# Patient Record
Sex: Female | Born: 1955
Health system: Southern US, Community
[De-identification: ages and names within clinical notes are randomized; demographics above are authoritative.]

## PROBLEM LIST (undated history)

## (undated) DIAGNOSIS — L409 Psoriasis, unspecified: Secondary | ICD-10-CM

## (undated) DIAGNOSIS — I10 Essential (primary) hypertension: Secondary | ICD-10-CM

## (undated) DIAGNOSIS — K219 Gastro-esophageal reflux disease without esophagitis: Secondary | ICD-10-CM

## (undated) DIAGNOSIS — G4733 Obstructive sleep apnea (adult) (pediatric): Secondary | ICD-10-CM

## (undated) DIAGNOSIS — J309 Allergic rhinitis, unspecified: Secondary | ICD-10-CM

## (undated) DIAGNOSIS — M1990 Unspecified osteoarthritis, unspecified site: Secondary | ICD-10-CM

## (undated) DIAGNOSIS — R011 Cardiac murmur, unspecified: Secondary | ICD-10-CM

## (undated) DIAGNOSIS — G5760 Lesion of plantar nerve, unspecified lower limb: Secondary | ICD-10-CM

## (undated) DIAGNOSIS — M26629 Arthralgia of temporomandibular joint, unspecified side: Secondary | ICD-10-CM

## (undated) DIAGNOSIS — B351 Tinea unguium: Secondary | ICD-10-CM

## (undated) DIAGNOSIS — H548 Legal blindness, as defined in USA: Secondary | ICD-10-CM

## (undated) DIAGNOSIS — M19019 Primary osteoarthritis, unspecified shoulder: Secondary | ICD-10-CM

## (undated) DIAGNOSIS — Z87442 Personal history of urinary calculi: Secondary | ICD-10-CM

## (undated) DIAGNOSIS — D649 Anemia, unspecified: Secondary | ICD-10-CM

## (undated) DIAGNOSIS — Z8719 Personal history of other diseases of the digestive system: Secondary | ICD-10-CM

## (undated) DIAGNOSIS — H3530 Unspecified macular degeneration: Secondary | ICD-10-CM

## (undated) HISTORY — DX: Unspecified osteoarthritis, unspecified site: M19.90

## (undated) HISTORY — DX: Gastro-esophageal reflux disease without esophagitis: K21.9

## (undated) HISTORY — DX: Essential (primary) hypertension: I10

## (undated) HISTORY — DX: Arthralgia of temporomandibular joint, unspecified side: M26.629

## (undated) HISTORY — PX: KNEE ARTHROSCOPY: SUR90

## (undated) HISTORY — DX: Unspecified macular degeneration: H35.30

## (undated) HISTORY — DX: Legal blindness, as defined in USA: H54.8

## (undated) HISTORY — DX: Lesion of plantar nerve, unspecified lower limb: G57.60

## (undated) HISTORY — DX: Primary osteoarthritis, unspecified shoulder: M19.019

## (undated) HISTORY — DX: Allergic rhinitis, unspecified: J30.9

## (undated) HISTORY — PX: CHOLECYSTECTOMY: SHX55

## (undated) HISTORY — PX: ABDOMINAL HYSTERECTOMY: SHX81

## (undated) HISTORY — DX: Obstructive sleep apnea (adult) (pediatric): G47.33

## (undated) HISTORY — PX: OTHER SURGICAL HISTORY: SHX169

## (undated) HISTORY — PX: COLONOSCOPY: SHX174

## (undated) HISTORY — DX: Tinea unguium: B35.1

## (undated) HISTORY — DX: Psoriasis, unspecified: L40.9

---

## 1998-03-18 ENCOUNTER — Ambulatory Visit (HOSPITAL_COMMUNITY): Admission: RE | Admit: 1998-03-18 | Discharge: 1998-03-18 | Payer: Self-pay | Admitting: Family Medicine

## 1998-03-18 ENCOUNTER — Encounter: Payer: Self-pay | Admitting: Family Medicine

## 1998-06-29 ENCOUNTER — Ambulatory Visit (HOSPITAL_COMMUNITY): Admission: RE | Admit: 1998-06-29 | Discharge: 1998-06-29 | Payer: Self-pay | Admitting: Gastroenterology

## 1998-07-24 ENCOUNTER — Encounter: Payer: Self-pay | Admitting: Surgery

## 1998-07-24 ENCOUNTER — Ambulatory Visit (HOSPITAL_COMMUNITY): Admission: RE | Admit: 1998-07-24 | Discharge: 1998-07-24 | Payer: Self-pay | Admitting: Surgery

## 1998-09-04 ENCOUNTER — Encounter: Payer: Self-pay | Admitting: Surgery

## 1998-09-09 ENCOUNTER — Encounter: Payer: Self-pay | Admitting: Surgery

## 1998-09-09 ENCOUNTER — Inpatient Hospital Stay (HOSPITAL_COMMUNITY): Admission: RE | Admit: 1998-09-09 | Discharge: 1998-09-12 | Payer: Self-pay | Admitting: Surgery

## 1998-09-10 ENCOUNTER — Encounter: Payer: Self-pay | Admitting: Surgery

## 1999-07-13 ENCOUNTER — Ambulatory Visit (HOSPITAL_COMMUNITY): Admission: RE | Admit: 1999-07-13 | Discharge: 1999-07-13 | Payer: Self-pay | Admitting: Gastroenterology

## 1999-07-13 ENCOUNTER — Encounter (INDEPENDENT_AMBULATORY_CARE_PROVIDER_SITE_OTHER): Payer: Self-pay | Admitting: Specialist

## 1999-09-24 ENCOUNTER — Ambulatory Visit (HOSPITAL_COMMUNITY): Admission: RE | Admit: 1999-09-24 | Discharge: 1999-09-24 | Payer: Self-pay | Admitting: Gastroenterology

## 2000-11-04 ENCOUNTER — Encounter: Payer: Self-pay | Admitting: Orthopedic Surgery

## 2000-11-04 ENCOUNTER — Ambulatory Visit (HOSPITAL_COMMUNITY): Admission: RE | Admit: 2000-11-04 | Discharge: 2000-11-04 | Payer: Self-pay | Admitting: Orthopedic Surgery

## 2001-02-09 ENCOUNTER — Encounter (INDEPENDENT_AMBULATORY_CARE_PROVIDER_SITE_OTHER): Payer: Self-pay | Admitting: *Deleted

## 2001-02-09 ENCOUNTER — Ambulatory Visit (HOSPITAL_COMMUNITY): Admission: RE | Admit: 2001-02-09 | Discharge: 2001-02-09 | Payer: Self-pay | Admitting: Gastroenterology

## 2002-05-21 ENCOUNTER — Encounter: Admission: RE | Admit: 2002-05-21 | Discharge: 2002-05-21 | Payer: Self-pay | Admitting: *Deleted

## 2002-05-21 ENCOUNTER — Encounter: Payer: Self-pay | Admitting: *Deleted

## 2003-07-09 ENCOUNTER — Encounter: Admission: RE | Admit: 2003-07-09 | Discharge: 2003-07-09 | Payer: Self-pay | Admitting: *Deleted

## 2003-10-17 ENCOUNTER — Ambulatory Visit (HOSPITAL_COMMUNITY): Admission: RE | Admit: 2003-10-17 | Discharge: 2003-10-17 | Payer: Self-pay | Admitting: Gastroenterology

## 2003-10-17 ENCOUNTER — Encounter (INDEPENDENT_AMBULATORY_CARE_PROVIDER_SITE_OTHER): Payer: Self-pay | Admitting: Specialist

## 2004-07-15 ENCOUNTER — Encounter: Admission: RE | Admit: 2004-07-15 | Discharge: 2004-07-15 | Payer: Self-pay | Admitting: Obstetrics and Gynecology

## 2009-09-24 ENCOUNTER — Ambulatory Visit (HOSPITAL_COMMUNITY): Admission: RE | Admit: 2009-09-24 | Discharge: 2009-09-24 | Payer: Self-pay | Admitting: Urology

## 2010-06-10 ENCOUNTER — Ambulatory Visit (HOSPITAL_COMMUNITY)
Admission: RE | Admit: 2010-06-10 | Discharge: 2010-06-10 | Payer: Self-pay | Source: Home / Self Care | Attending: Gastroenterology | Admitting: Gastroenterology

## 2010-08-02 ENCOUNTER — Other Ambulatory Visit: Payer: Self-pay | Admitting: Gynecologic Oncology

## 2010-08-02 DIAGNOSIS — IMO0002 Reserved for concepts with insufficient information to code with codable children: Secondary | ICD-10-CM

## 2010-08-27 ENCOUNTER — Other Ambulatory Visit: Payer: Self-pay | Admitting: Dermatology

## 2010-10-20 ENCOUNTER — Ambulatory Visit: Payer: Self-pay | Admitting: Gynecologic Oncology

## 2010-10-20 ENCOUNTER — Other Ambulatory Visit (HOSPITAL_COMMUNITY): Payer: Self-pay

## 2010-11-19 NOTE — Procedures (Signed)
[place]. [facility]  Patient:    Kathryn Pittman                  MRN: [id number] Proc. Date: 07/13/99 Adm. Date:  [id number] Attending:  Rich Brave CC:         Thornton Park. Daphine Deutscher, M.D.             Alfonzo Beers, P.A.C.             at [facility]                           Procedure Report  PROCEDURE:  Upper endoscopy with biopsies.  INDICATION:  Follow-up of Barretts esophagus with biopsies indeterminate for dysplasia a year ago.  In the interim, approximately nine months ago, the patient underwent surgical repair of a large hiatal hernia and an antireflux procedure.   FINDINGS:  Distal esophagitis.  DESCRIPTION OF PROCEDURE:  The nature, purpose, and risks of the procedure were  familiar to the patient from prior examination and she provided written consent. Sedation was fentanyl 75 mcg and Versed 7 mg IV without arrhythmias or desaturation.  The new Olympus video adult endoscope was passed under direct vision.  The vocal cords looked grossly normal and the esophagus was easily entered.  The proximal esophagus was normal.  In contrast to her previous procedure, no free gastroesophageal reflux was noted at this time.  However, the distal 2 to 3 cm f the esophagus had inflammatory changes characterized by squamous peeling erythema and friability without any evidence of mass-effect.  There was a ringlike stricture present but it was fairly widely patent and offered no resistance to passage of the small 8 mm endoscope.  There did appear to be a  residual 2 to 3 cm hiatal hernia.  The stomach was entered.  It contained a small clear residual.  The gastric mucosa was unremarkable, specifically without evidence of gastritis, erosions, ulcers,  polyps, or masses and the pylorus, duodenal bulb, and second duodenum were unremarkable.  Retroflex view of the proximal stomach was unremarkable, specifically  without any obvious patulous diaphragmatic hiatus but also without an obvious "wrap" effect.  In view of the inflammatory changes in the distal esophagus, I elected not to proceed with esophageal dilatation at this time even though the patient did have an episode of recurrent significant dysphagia recently.  However, multiple biopsies were obtained from the inflamed segment prior to removal of the scope.  The patient tolerated the procedure well and there were no apparent complications.  IMPRESSION: 1. Distal esophagitis. 2. Small residual hiatal hernia.  PLAN:  Await pathology and biopsies.  Reinstitute PPI medication therapy. Follow-up endoscopy in about two months to assess healing of the esophagus, and if healed, perform esophageal dilatation at that time. DD:  07/13/99 TD:  07/13/99 Job: [id number]

## 2010-11-19 NOTE — Procedures (Signed)
[place]. [facility]  Patient:    Kathryn Pittman, Kathryn Pittman                 MRN: [id number] Proc. Date: 02/09/01 Adm. Date:  [id number] Attending:  Rich Brave CC:         Norville Haggard, PA-C [facility]   Procedure Report  PROCEDURE PERFORMED:  Upper endoscopy with biopsies.  ENDOSCOPIST:  Florencia Reasons, M.D.  INDICATIONS FOR PROCEDURE:  This 55 year old female several years go had endoscopic biopsies of what appeared to be Barretts appearing mucosa.  They came back indeterminate for dysplasia.  Subsequently, the patient underwent antireflux surgery and her esophagitis resolved endoscopic evaluation about a year and a half ago showed no evident Barretts, so no biopsies were taken at that time.  This procedure is being done to help exclude any residual Barretts esophagus.  FINDINGS:  Slight mucosal irregularity with erythema without evident Barretts esophagus in the region of the gastroesophageal junction, biopsied.  Small residual hiatal hernia.  DESCRIPTION OF PROCEDURE:  The nature, purpose and risks of the procedure were familiar to the patient from prior examination and she provided written consent.  Sedation was fentanyl 100 mcg and Versed 10 mg IV without arrhythmias or desaturation.  The Olympus video endoscope was passed under direct vision.  The vocal cords were not well seen. The esophagus was entered with minimal difficulty and had normal mucosa.  Initially, the patient was a little bit active but she settled down later during the procedure.  In the distal esophagus, there was some patchy erythema but no obvious Barretts esophagus.  Biopsies were obtained from this area at the conclusion of the procedure to help confirm the absence of any Barretts esophagus there.  There was no evident ring or stricture but there was a small residual hiatal hernia.  The stomach was entered.  It contained no significant residual  and had normal mucosa without evidence of gastritis, erosions, ulcers, polyps or masses.  There were some slightly prominent folds in the antral region but these were not felt to be clinically significant.  Retroflex viewing of the proximal stomach showed normal appearance of her wrap, status post  Nissen fundoplication.  The pylorus, duodenal bulb and second duodenum looked normal.  Biopsies were obtained as mentioned in the distal esophagus prior to removal of the scope.  The patient tolerated the procedure well and there were no apparent complications.  IMPRESSION:  Probable absence of any Barretts esophagus.  PLAN:  Await pathology on biopsies. Continue every other day Protonix for management of residual reflux symptoms. DD:  02/09/01 TD:  02/10/01 Job: [id number]

## 2010-11-19 NOTE — Op Note (Signed)
NAME:  Kathryn Pittman, Kathryn Pittman                          ACCOUNT NO.:  [id number]   MEDICAL RECORD NO.:  [id number]                   PATIENT TYPE:  AMB   LOCATION:  ENDO                                 FACILITY:  [facility]   PHYSICIAN:  Bernette Redbird, M.D.                DATE OF BIRTH:  02/20/1956   DATE OF PROCEDURE:  10/17/2003  DATE OF DISCHARGE:                                 OPERATIVE REPORT   PROCEDURE:  Upper endoscopy with biopsies.   INDICATIONS:  [name] is a pleasant 55 year old female several years status  post a Nissen fundoplication by Dr. Wenda Low, with previous esophageal  biopsies showing intestinal metaplasia at the GE junction without frank  tongues of Barrett's mucosa.  Therefore, this is being done for surveillance  of intestinal metaplasia.   FINDINGS:  Minimal changes of possible Barrett's versus an irregular Z-line.  Possible disruption of the Nissen wrap.   PROCEDURE:  The nature, purpose, risks of the procedure were given to the  patient from previous examination and she provided informed consent.  Sedation was Fentanyl 50 micrograms and Versed 5 mg IV without arrhythmias  or desaturation.  The Olympus video endoscope was passed under direct  vision.  The vocal cords looked normal.  The esophagus was readily entered.  The proximal esophagus was normal.  In the distal esophagus, right at the GE  junction, there was some irregularity of the Z-line and patchy weather-  beaten erythema and/or Barrett's appearing mucosa without classic or frank  tongues of Barrett's mucosa present.  No ring or stricture was present, no  free reflux was seen, no mass effect.  There appeared to be a 3 cm hiatal  hernia present.   The stomach was entered.  It contained a small, clear residual.  The gastric  mucosa was unremarkable, without evidence of gastritis erosions, ulcers,  polyps or masses and the pylorus, duodenal bulb, and second duodenum looked  normal.   Retroflexed viewing  showed what appeared to be a disrupted wrap with a lot  of floppy mucosa hanging down alongside the scope but not really a firm,  snug wrap around the scope.   Prior to removal of the scope, I obtained multiple biopsies from the region  of the squamocolumnar junction.   The patient tolerated the procedure well and there were no apparent  complications.   IMPRESSION:  1. Subtle abnormalities in the region of the gastroesophageal junction     without frank Barrett's tongues being present.  2. Possible disruption of the Nissen wrap.   PLAN:  Await pathology results.  In the meantime, continue PPI therapy.  Since the patient is not having active reflux symptoms, I do not feel that  the possible anatomic disruption of the wrap needs to be addressed at this  time.  Bernette Redbird, M.D.    RB/MEDQ  D:  10/17/2003  T:  10/17/2003  Job:  [id number]   cc:   [facility]   Thornton Park. Daphine Deutscher, M.D.  [address]  [place]  [place] [address]  Fax: [phone]

## 2010-11-19 NOTE — Procedures (Signed)
[place]. [facility]  Patient:    Kathryn Pittman, Kathryn Pittman                 MRN: [id number] Proc. Date: 09/24/99 Adm. Date:  [id number] Attending:  Rich Brave CC:         Thornton Park. Daphine Deutscher, M.D.             Alfonzo Beers, P.A.C., [facility]                           Procedure Report  PROCEDURE:  Upper endoscopy.  SURGEON:  Florencia Reasons, M.D.  INDICATIONS:  This patient is 2 months status post endoscopy, which showed erosive esophagitis despite a previous antireflux operation.  She has been treated with  Prilosec in the interim and has been free of the dysphagia symptoms she was previously having.  Although, biopsies of Barretts-appearing mucosa about 1 year ago were indeterminate for dysphagia.  Biopsies at the time of her most recent endoscopy did not show any definite Barretts epithelium.  FINDINGS:  Resolution of reflux esophagitis with no definite Barretts esophagus  identifiable.  PROCEDURE:  The nature, purpose, and risks of the procedure were familiar to the patient from prior examinations and she provided written consent.  Sedation was  fentanyl 80 mcg and Versed 8 mg IV without arrhythmias or desaturation.  The Olympus small caliber adult video endoscope was passed under direct vision.  The vocal cords and larynx looked normal and the esophagus was easily entered.  The esophageal mucosa was normal, specifically without any evidence of any definite Barretts esophagus in the distal esophagus nor any evidence of reflux esophagitis. There was some slight irregularity to the color of the distal esophageal mucosa  just above the gastroesophageal junction, but no frank tongues of gastric-appearing mucosa extending for any significant distance up the esophagus to suggest the presence of true Barretts esophagus, and no evidence of any definite inflammatory changes.  No varices, infection or neoplasia were seen,  either.  The patient did not have any definite esophageal ring or stricture.  A 2-3 cm hiatal hernia was present.  On entering the stomach, a retroflexed view of the proximal stomach was done, which showed some redundant gastric mucosa at the site of her wrap at the diaphragmatic hiatus.  The gastric mucosa was normal in all locations, without evidence of gastritis, erosions, ulcers, polyps or masses.  The pylorus, duodenal bulb and second duodenum were similarly normal.  I did not see mucosal abnormalities to necessitate biopsy evaluation in the distal esophagus, so the scope was then removed from the patient without taking any biopsies.  The patient tolerated the procedure well and there were no apparent complications.  IMPRESSION: 1. Resolution of previous reflux esophagitis. 2. Small to moderate amount of persistent hiatal hernia, status post antireflux  surgery.  It might be noted that while insufflating air into the patients    stomach, she tended to burp fairly easily suggesting that the wrap was properly    somewhat loose, which would of course also correlate with the fact that she    developed esophagitis postoperatively.  PLAN:  The patient should probably remain on some sort of antipeptic therapy because she would be at risk for recurrent ulceration if her therapy was stopped. However, in view of the improvement seen today, I think it would be okay for her to cut back to every other day dosing of a  PPI.  She should probably have a follow-up endoscopy in a year or so to help finally settle the question as to whether or ot any significant Barretts esophagus is present and to assess maintenance of healing of her esophagitis. DD:  09/24/99 TD:  09/24/99 Job: [id number]

## 2011-09-02 DIAGNOSIS — Z79899 Other long term (current) drug therapy: Secondary | ICD-10-CM | POA: Diagnosis not present

## 2011-09-02 DIAGNOSIS — L408 Other psoriasis: Secondary | ICD-10-CM | POA: Diagnosis not present

## 2011-10-21 DIAGNOSIS — K219 Gastro-esophageal reflux disease without esophagitis: Secondary | ICD-10-CM | POA: Diagnosis not present

## 2011-10-21 DIAGNOSIS — B351 Tinea unguium: Secondary | ICD-10-CM | POA: Diagnosis not present

## 2011-10-21 DIAGNOSIS — M545 Low back pain: Secondary | ICD-10-CM | POA: Diagnosis not present

## 2011-10-21 DIAGNOSIS — I10 Essential (primary) hypertension: Secondary | ICD-10-CM | POA: Diagnosis not present

## 2011-10-21 DIAGNOSIS — J309 Allergic rhinitis, unspecified: Secondary | ICD-10-CM | POA: Diagnosis not present

## 2011-10-21 DIAGNOSIS — R7309 Other abnormal glucose: Secondary | ICD-10-CM | POA: Diagnosis not present

## 2011-10-21 DIAGNOSIS — L408 Other psoriasis: Secondary | ICD-10-CM | POA: Diagnosis not present

## 2011-10-21 DIAGNOSIS — G4733 Obstructive sleep apnea (adult) (pediatric): Secondary | ICD-10-CM | POA: Diagnosis not present

## 2011-11-04 ENCOUNTER — Other Ambulatory Visit (HOSPITAL_COMMUNITY): Payer: Self-pay | Admitting: Family Medicine

## 2011-11-04 ENCOUNTER — Ambulatory Visit (HOSPITAL_COMMUNITY)
Admission: RE | Admit: 2011-11-04 | Discharge: 2011-11-04 | Disposition: A | Discharge status: Home / Self Care | Payer: 59 | Source: Ambulatory Visit | Attending: Family Medicine | Admitting: Family Medicine

## 2011-11-04 DIAGNOSIS — M25569 Pain in unspecified knee: Secondary | ICD-10-CM | POA: Insufficient documentation

## 2011-11-04 DIAGNOSIS — R52 Pain, unspecified: Secondary | ICD-10-CM

## 2011-11-04 DIAGNOSIS — W19XXXA Unspecified fall, initial encounter: Secondary | ICD-10-CM | POA: Insufficient documentation

## 2011-11-04 DIAGNOSIS — M79609 Pain in unspecified limb: Secondary | ICD-10-CM | POA: Diagnosis not present

## 2011-11-07 DIAGNOSIS — M25569 Pain in unspecified knee: Secondary | ICD-10-CM | POA: Diagnosis not present

## 2011-11-07 DIAGNOSIS — S86819A Strain of other muscle(s) and tendon(s) at lower leg level, unspecified leg, initial encounter: Secondary | ICD-10-CM | POA: Diagnosis not present

## 2011-11-07 DIAGNOSIS — S838X9A Sprain of other specified parts of unspecified knee, initial encounter: Secondary | ICD-10-CM | POA: Diagnosis not present

## 2011-11-29 DIAGNOSIS — S86819A Strain of other muscle(s) and tendon(s) at lower leg level, unspecified leg, initial encounter: Secondary | ICD-10-CM | POA: Diagnosis not present

## 2011-11-29 DIAGNOSIS — M25569 Pain in unspecified knee: Secondary | ICD-10-CM | POA: Diagnosis not present

## 2011-11-29 DIAGNOSIS — S838X9A Sprain of other specified parts of unspecified knee, initial encounter: Secondary | ICD-10-CM | POA: Diagnosis not present

## 2011-12-09 DIAGNOSIS — I10 Essential (primary) hypertension: Secondary | ICD-10-CM | POA: Diagnosis not present

## 2011-12-09 DIAGNOSIS — G4733 Obstructive sleep apnea (adult) (pediatric): Secondary | ICD-10-CM | POA: Diagnosis not present

## 2011-12-09 DIAGNOSIS — E669 Obesity, unspecified: Secondary | ICD-10-CM | POA: Diagnosis not present

## 2011-12-20 DIAGNOSIS — H00019 Hordeolum externum unspecified eye, unspecified eyelid: Secondary | ICD-10-CM | POA: Diagnosis not present

## 2012-01-02 DIAGNOSIS — M1710 Unilateral primary osteoarthritis, unspecified knee: Secondary | ICD-10-CM | POA: Diagnosis not present

## 2012-02-29 DIAGNOSIS — M25569 Pain in unspecified knee: Secondary | ICD-10-CM | POA: Diagnosis not present

## 2012-03-09 DIAGNOSIS — L408 Other psoriasis: Secondary | ICD-10-CM | POA: Diagnosis not present

## 2012-03-09 DIAGNOSIS — Z79899 Other long term (current) drug therapy: Secondary | ICD-10-CM | POA: Diagnosis not present

## 2012-04-12 DIAGNOSIS — M25569 Pain in unspecified knee: Secondary | ICD-10-CM | POA: Diagnosis not present

## 2012-04-13 DIAGNOSIS — K2270 Barrett's esophagus without dysplasia: Secondary | ICD-10-CM | POA: Diagnosis not present

## 2012-04-13 DIAGNOSIS — K219 Gastro-esophageal reflux disease without esophagitis: Secondary | ICD-10-CM | POA: Diagnosis not present

## 2012-04-13 DIAGNOSIS — Z86010 Personal history of colonic polyps: Secondary | ICD-10-CM | POA: Diagnosis not present

## 2012-04-13 DIAGNOSIS — Z79899 Other long term (current) drug therapy: Secondary | ICD-10-CM | POA: Diagnosis not present

## 2012-04-27 DIAGNOSIS — M2669 Other specified disorders of temporomandibular joint: Secondary | ICD-10-CM | POA: Diagnosis not present

## 2012-04-27 DIAGNOSIS — J309 Allergic rhinitis, unspecified: Secondary | ICD-10-CM | POA: Diagnosis not present

## 2012-04-27 DIAGNOSIS — Z23 Encounter for immunization: Secondary | ICD-10-CM | POA: Diagnosis not present

## 2012-04-27 DIAGNOSIS — R7309 Other abnormal glucose: Secondary | ICD-10-CM | POA: Diagnosis not present

## 2012-04-27 DIAGNOSIS — K219 Gastro-esophageal reflux disease without esophagitis: Secondary | ICD-10-CM | POA: Diagnosis not present

## 2012-04-27 DIAGNOSIS — I10 Essential (primary) hypertension: Secondary | ICD-10-CM | POA: Diagnosis not present

## 2012-04-27 DIAGNOSIS — L408 Other psoriasis: Secondary | ICD-10-CM | POA: Diagnosis not present

## 2012-04-27 DIAGNOSIS — B351 Tinea unguium: Secondary | ICD-10-CM | POA: Diagnosis not present

## 2012-04-27 DIAGNOSIS — G4733 Obstructive sleep apnea (adult) (pediatric): Secondary | ICD-10-CM | POA: Diagnosis not present

## 2012-05-01 DIAGNOSIS — M25569 Pain in unspecified knee: Secondary | ICD-10-CM | POA: Diagnosis not present

## 2012-05-29 DIAGNOSIS — M2340 Loose body in knee, unspecified knee: Secondary | ICD-10-CM | POA: Diagnosis not present

## 2012-05-29 DIAGNOSIS — M25569 Pain in unspecified knee: Secondary | ICD-10-CM | POA: Diagnosis not present

## 2012-06-08 DIAGNOSIS — G4733 Obstructive sleep apnea (adult) (pediatric): Secondary | ICD-10-CM | POA: Diagnosis not present

## 2012-06-08 DIAGNOSIS — I10 Essential (primary) hypertension: Secondary | ICD-10-CM | POA: Diagnosis not present

## 2012-06-11 DIAGNOSIS — M25569 Pain in unspecified knee: Secondary | ICD-10-CM | POA: Diagnosis not present

## 2012-06-11 DIAGNOSIS — M2240 Chondromalacia patellae, unspecified knee: Secondary | ICD-10-CM | POA: Diagnosis not present

## 2012-06-11 DIAGNOSIS — M25469 Effusion, unspecified knee: Secondary | ICD-10-CM | POA: Diagnosis not present

## 2012-06-11 DIAGNOSIS — G8918 Other acute postprocedural pain: Secondary | ICD-10-CM | POA: Diagnosis not present

## 2012-06-11 DIAGNOSIS — M6750 Plica syndrome, unspecified knee: Secondary | ICD-10-CM | POA: Diagnosis not present

## 2012-06-11 DIAGNOSIS — M9420 Chondromalacia, unspecified site: Secondary | ICD-10-CM | POA: Diagnosis not present

## 2012-06-21 DIAGNOSIS — H2510 Age-related nuclear cataract, unspecified eye: Secondary | ICD-10-CM | POA: Diagnosis not present

## 2012-06-21 DIAGNOSIS — H3553 Other dystrophies primarily involving the sensory retina: Secondary | ICD-10-CM | POA: Diagnosis not present

## 2012-06-21 DIAGNOSIS — H353190 Nonexudative age-related macular degeneration, unspecified eye, stage unspecified: Secondary | ICD-10-CM | POA: Diagnosis not present

## 2012-07-26 DIAGNOSIS — J019 Acute sinusitis, unspecified: Secondary | ICD-10-CM | POA: Diagnosis not present

## 2012-08-27 DIAGNOSIS — M25579 Pain in unspecified ankle and joints of unspecified foot: Secondary | ICD-10-CM | POA: Diagnosis not present

## 2012-08-27 DIAGNOSIS — S93419A Sprain of calcaneofibular ligament of unspecified ankle, initial encounter: Secondary | ICD-10-CM | POA: Diagnosis not present

## 2012-09-10 DIAGNOSIS — L408 Other psoriasis: Secondary | ICD-10-CM | POA: Diagnosis not present

## 2012-10-26 DIAGNOSIS — M2669 Other specified disorders of temporomandibular joint: Secondary | ICD-10-CM | POA: Diagnosis not present

## 2012-10-26 DIAGNOSIS — J309 Allergic rhinitis, unspecified: Secondary | ICD-10-CM | POA: Diagnosis not present

## 2012-10-26 DIAGNOSIS — L408 Other psoriasis: Secondary | ICD-10-CM | POA: Diagnosis not present

## 2012-10-26 DIAGNOSIS — K219 Gastro-esophageal reflux disease without esophagitis: Secondary | ICD-10-CM | POA: Diagnosis not present

## 2012-10-26 DIAGNOSIS — I10 Essential (primary) hypertension: Secondary | ICD-10-CM | POA: Diagnosis not present

## 2012-10-26 DIAGNOSIS — R7309 Other abnormal glucose: Secondary | ICD-10-CM | POA: Diagnosis not present

## 2012-10-26 DIAGNOSIS — G4733 Obstructive sleep apnea (adult) (pediatric): Secondary | ICD-10-CM | POA: Diagnosis not present

## 2012-12-07 DIAGNOSIS — G4733 Obstructive sleep apnea (adult) (pediatric): Secondary | ICD-10-CM | POA: Diagnosis not present

## 2012-12-07 DIAGNOSIS — I10 Essential (primary) hypertension: Secondary | ICD-10-CM | POA: Diagnosis not present

## 2013-03-15 DIAGNOSIS — Z79899 Other long term (current) drug therapy: Secondary | ICD-10-CM | POA: Diagnosis not present

## 2013-03-15 DIAGNOSIS — L408 Other psoriasis: Secondary | ICD-10-CM | POA: Diagnosis not present

## 2013-03-15 DIAGNOSIS — L570 Actinic keratosis: Secondary | ICD-10-CM | POA: Diagnosis not present

## 2013-04-26 DIAGNOSIS — L408 Other psoriasis: Secondary | ICD-10-CM | POA: Diagnosis not present

## 2013-04-26 DIAGNOSIS — M19049 Primary osteoarthritis, unspecified hand: Secondary | ICD-10-CM | POA: Diagnosis not present

## 2013-04-26 DIAGNOSIS — K219 Gastro-esophageal reflux disease without esophagitis: Secondary | ICD-10-CM | POA: Diagnosis not present

## 2013-04-26 DIAGNOSIS — R7309 Other abnormal glucose: Secondary | ICD-10-CM | POA: Diagnosis not present

## 2013-04-26 DIAGNOSIS — I10 Essential (primary) hypertension: Secondary | ICD-10-CM | POA: Diagnosis not present

## 2013-04-26 DIAGNOSIS — G4733 Obstructive sleep apnea (adult) (pediatric): Secondary | ICD-10-CM | POA: Diagnosis not present

## 2013-04-26 DIAGNOSIS — Z23 Encounter for immunization: Secondary | ICD-10-CM | POA: Diagnosis not present

## 2013-04-26 DIAGNOSIS — J309 Allergic rhinitis, unspecified: Secondary | ICD-10-CM | POA: Diagnosis not present

## 2013-04-26 DIAGNOSIS — R498 Other voice and resonance disorders: Secondary | ICD-10-CM | POA: Diagnosis not present

## 2013-05-24 DIAGNOSIS — K2270 Barrett's esophagus without dysplasia: Secondary | ICD-10-CM | POA: Diagnosis not present

## 2013-05-24 DIAGNOSIS — Z86010 Personal history of colonic polyps: Secondary | ICD-10-CM | POA: Diagnosis not present

## 2013-05-24 DIAGNOSIS — K219 Gastro-esophageal reflux disease without esophagitis: Secondary | ICD-10-CM | POA: Diagnosis not present

## 2013-06-07 ENCOUNTER — Telehealth: Payer: Self-pay | Admitting: Cardiology

## 2013-06-07 NOTE — Telephone Encounter (Signed)
[facility] calling to follow Up on a fax that was sent 11/24 and 11/19/// with no response/// This is for the patients CPAP supplies// please call back to discuss.

## 2013-06-07 NOTE — Telephone Encounter (Signed)
Called and asked [employer] to refax

## 2013-06-11 ENCOUNTER — Telehealth: Payer: Self-pay | Admitting: Cardiology

## 2013-06-11 NOTE — Telephone Encounter (Signed)
Informed Aggie Cosier from [place] that Dr. Mayford Knife is out of the office today and working in the hospital. Message/request will be routed to both Dr. Mayford Knife and Launa Flight, RMA, for follow up.

## 2013-06-11 NOTE — Telephone Encounter (Signed)
New Problem:  Kathryn Pittman is calling to get an update on a medical necessity form. Kathryn Pittman is requesting a call back.

## 2013-06-11 NOTE — Telephone Encounter (Signed)
Do you have this form?

## 2013-06-12 NOTE — Telephone Encounter (Signed)
You already signed and I faxed back to [facility]. I called them and they verified they received it yesterday.

## 2013-06-14 ENCOUNTER — Ambulatory Visit: Payer: 59 | Admitting: Cardiology

## 2013-06-20 DIAGNOSIS — H3553 Other dystrophies primarily involving the sensory retina: Secondary | ICD-10-CM | POA: Diagnosis not present

## 2013-06-20 DIAGNOSIS — H2510 Age-related nuclear cataract, unspecified eye: Secondary | ICD-10-CM | POA: Diagnosis not present

## 2013-06-20 DIAGNOSIS — H353190 Nonexudative age-related macular degeneration, unspecified eye, stage unspecified: Secondary | ICD-10-CM | POA: Diagnosis not present

## 2013-06-24 ENCOUNTER — Encounter: Payer: Self-pay | Admitting: General Surgery

## 2013-06-24 DIAGNOSIS — I10 Essential (primary) hypertension: Secondary | ICD-10-CM

## 2013-06-25 ENCOUNTER — Encounter: Payer: Self-pay | Admitting: Cardiology

## 2013-06-25 ENCOUNTER — Encounter (INDEPENDENT_AMBULATORY_CARE_PROVIDER_SITE_OTHER): Payer: Self-pay

## 2013-06-25 ENCOUNTER — Ambulatory Visit (INDEPENDENT_AMBULATORY_CARE_PROVIDER_SITE_OTHER): Payer: 59 | Admitting: Cardiology

## 2013-06-25 VITALS — BP 144/90 | HR 98 | Ht 62.5 in | Wt 289.0 lb

## 2013-06-25 DIAGNOSIS — I10 Essential (primary) hypertension: Secondary | ICD-10-CM | POA: Diagnosis not present

## 2013-06-25 DIAGNOSIS — G4733 Obstructive sleep apnea (adult) (pediatric): Secondary | ICD-10-CM | POA: Diagnosis not present

## 2013-06-25 NOTE — Patient Instructions (Signed)
Your physician recommends that you continue on your current medications as directed. Please refer to the Current Medication list given to you today.  Your physician wants you to follow-up in: 6 Months with Dr Turner You will receive a reminder letter in the mail two months in advance. If you don't receive a letter, please call our office to schedule the follow-up appointment.  

## 2013-06-25 NOTE — Progress Notes (Signed)
[address] [place], [place]  [address] Phone: [phone] Fax:  [phone]  Date:  06/25/2013   ID:  Kathryn Pittman, DOB [birth year], MRN [id number]  PCP:  Kathryn Hoff, MD  Sleep Medicine:  Kathryn Magic, MD  History of Present Illness: Kathryn Pittman is a 57 y.o. female with a history of severe OSA on BiPAP, HTN and obesity.  She is doing well.  She tolerates her BiPAP without any problems.  She tolerates her full face mask and feels the pressure is adequate.  She feels rested in the am and has no daytime sleepiness.  She occasionally has some stuffiness of her nasal passages. She works out on the elliptical and stationary bike.   Wt Readings from Last 3 Encounters:  06/25/13 289 lb (131.09 kg)  06/24/13 285 lb 6.4 oz (129.457 kg)     Past Medical History  Diagnosis Date  . OSA (obstructive sleep apnea)     on BiPAP 16/12 cm H2O  . Hypertension   . GERD (gastroesophageal reflux disease)     with Barretts esophagus  . Allergic rhinitis   . Psoriasis   . Onychomycosis   . Morton's neuroma     Left  . Osteoarthritis, shoulder     right  . Macular degeneration, bilateral   . TMJ syndrome     Left>right  . Legally blind   . Arthritis     Current Outpatient Prescriptions  Medication Sig Dispense Refill  . adalimumab (HUMIRA PEN) 40 MG/0.8ML injection Inject 40 mg into the skin every 14 (fourteen) days.      . cetirizine (ZYRTEC) 10 MG tablet Take 10 mg by mouth daily.      . fluticasone (FLONASE) 50 MCG/ACT nasal spray Place 2 sprays into both nostrils daily.      . Glucosamine 500 MG CAPS Take 1 capsule by mouth 2 (two) times daily.      Marland Kitchen lisinopril (PRINIVIL,ZESTRIL) 40 MG tablet Take 40 mg by mouth daily.      . pantoprazole (PROTONIX) 40 MG tablet Take 40 mg by mouth daily.      . vitamin B-12 (CYANOCOBALAMIN) 100 MCG tablet Take 100 mcg by mouth daily.       No current facility-administered medications for this visit.    Allergies:      Allergies  Allergen Reactions  . Excedrin Extra Strength [Aspirin-Acetaminophen-Caffeine] Itching    Social History:  The patient  reports that she has never smoked. She does not have any smokeless tobacco history on file. She reports that she does not drink alcohol or use illicit drugs.   Family History:  The patient's family history is not on file.   ROS:  Please see the history of present illness.      All other systems reviewed and negative.   PHYSICAL EXAM: VS:  BP 144/90  Pulse 98  Ht 5' 2.5" (1.588 m)  Wt 289 lb (131.09 kg)  BMI 51.98 kg/m2 Well nourished, well developed, in no acute distress HEENT: normal Neck: no JVD Cardiac:  normal S1, S2; RRR; no murmur Lungs:  clear to auscultation bilaterally, no wheezing, rhonchi or rales Abd: soft, nontender, no hepatomegaly Ext: no edema Skin: warm and dry Neuro:  CNs 2-12 intact, no focal abnormalities noted       ASSESSMENT AND PLAN:  1. Severe OSA on BiPAP - I will get a download from DME 2. HTN - her BP at home runs around 120/30mmHg  -  continue Lisinopril 3. Obesity - I have encouarged her to continue her exercise regimen and follow a low fat diet  Followup with me in 6 months  Signed, Kathryn Magic, MD 06/25/2013 1:53 PM

## 2013-09-13 DIAGNOSIS — Z79899 Other long term (current) drug therapy: Secondary | ICD-10-CM | POA: Diagnosis not present

## 2013-09-13 DIAGNOSIS — L408 Other psoriasis: Secondary | ICD-10-CM | POA: Diagnosis not present

## 2013-10-25 DIAGNOSIS — R7309 Other abnormal glucose: Secondary | ICD-10-CM | POA: Diagnosis not present

## 2013-10-25 DIAGNOSIS — K219 Gastro-esophageal reflux disease without esophagitis: Secondary | ICD-10-CM | POA: Diagnosis not present

## 2013-10-25 DIAGNOSIS — G4733 Obstructive sleep apnea (adult) (pediatric): Secondary | ICD-10-CM | POA: Diagnosis not present

## 2013-10-25 DIAGNOSIS — R209 Unspecified disturbances of skin sensation: Secondary | ICD-10-CM | POA: Diagnosis not present

## 2013-10-25 DIAGNOSIS — L408 Other psoriasis: Secondary | ICD-10-CM | POA: Diagnosis not present

## 2013-10-25 DIAGNOSIS — J309 Allergic rhinitis, unspecified: Secondary | ICD-10-CM | POA: Diagnosis not present

## 2013-10-25 DIAGNOSIS — I10 Essential (primary) hypertension: Secondary | ICD-10-CM | POA: Diagnosis not present

## 2014-02-11 ENCOUNTER — Encounter: Payer: Self-pay | Admitting: Cardiology

## 2014-02-21 ENCOUNTER — Ambulatory Visit: Payer: 59 | Admitting: Cardiology

## 2014-03-14 ENCOUNTER — Other Ambulatory Visit: Payer: Self-pay

## 2014-03-14 DIAGNOSIS — C44319 Basal cell carcinoma of skin of other parts of face: Secondary | ICD-10-CM | POA: Diagnosis not present

## 2014-03-14 DIAGNOSIS — L570 Actinic keratosis: Secondary | ICD-10-CM | POA: Diagnosis not present

## 2014-03-14 DIAGNOSIS — L408 Other psoriasis: Secondary | ICD-10-CM | POA: Diagnosis not present

## 2014-03-14 DIAGNOSIS — D485 Neoplasm of uncertain behavior of skin: Secondary | ICD-10-CM | POA: Diagnosis not present

## 2014-03-14 DIAGNOSIS — L821 Other seborrheic keratosis: Secondary | ICD-10-CM | POA: Diagnosis not present

## 2014-04-24 DIAGNOSIS — Z85828 Personal history of other malignant neoplasm of skin: Secondary | ICD-10-CM | POA: Diagnosis not present

## 2014-04-24 DIAGNOSIS — C44311 Basal cell carcinoma of skin of nose: Secondary | ICD-10-CM | POA: Diagnosis not present

## 2014-05-09 DIAGNOSIS — K219 Gastro-esophageal reflux disease without esophagitis: Secondary | ICD-10-CM | POA: Diagnosis not present

## 2014-05-09 DIAGNOSIS — J309 Allergic rhinitis, unspecified: Secondary | ICD-10-CM | POA: Diagnosis not present

## 2014-05-09 DIAGNOSIS — L409 Psoriasis, unspecified: Secondary | ICD-10-CM | POA: Diagnosis not present

## 2014-05-09 DIAGNOSIS — I10 Essential (primary) hypertension: Secondary | ICD-10-CM | POA: Diagnosis not present

## 2014-05-09 DIAGNOSIS — M79621 Pain in right upper arm: Secondary | ICD-10-CM | POA: Diagnosis not present

## 2014-05-09 DIAGNOSIS — G4733 Obstructive sleep apnea (adult) (pediatric): Secondary | ICD-10-CM | POA: Diagnosis not present

## 2014-05-09 DIAGNOSIS — R7309 Other abnormal glucose: Secondary | ICD-10-CM | POA: Diagnosis not present

## 2014-05-15 ENCOUNTER — Ambulatory Visit (INDEPENDENT_AMBULATORY_CARE_PROVIDER_SITE_OTHER): Payer: 59 | Admitting: Cardiology

## 2014-05-15 ENCOUNTER — Ambulatory Visit: Payer: 59 | Admitting: Cardiology

## 2014-05-15 ENCOUNTER — Encounter: Payer: Self-pay | Admitting: Cardiology

## 2014-05-15 VITALS — BP 142/80 | HR 97 | Ht 62.5 in | Wt 298.2 lb

## 2014-05-15 DIAGNOSIS — I10 Essential (primary) hypertension: Secondary | ICD-10-CM | POA: Diagnosis not present

## 2014-05-15 DIAGNOSIS — G4733 Obstructive sleep apnea (adult) (pediatric): Secondary | ICD-10-CM | POA: Diagnosis not present

## 2014-05-15 NOTE — Progress Notes (Signed)
[place], [address] [place], [place]  [address] Phone: [phone] Fax:  [phone]  Date:  05/15/2014   ID:  Kathryn Pittman, DOB [birth year], MRN [id number]  PCP:  Gara Kroner, MD  Cardiologist:  Fransico Him, MD    History of Present Illness: Kathryn Pittman is a 58 y.o. female with a history of severe OSA on BiPAP, HTN and obesity. She is doing well. She tolerates her BiPAP without any problems. She tolerates her full face mask and feels the pressure is adequate. She denies any nasal dryness or congestion.  She feels rested in the am and has no daytime sleepiness.She does not snore.  She works out on the elliptical and stationary bike.   Wt Readings from Last 3 Encounters:  05/15/14 298 lb 3.2 oz (135.263 kg)  06/25/13 289 lb (131.09 kg)  06/24/13 285 lb 6.4 oz (129.457 kg)     Past Medical History  Diagnosis Date  . OSA (obstructive sleep apnea)     on BiPAP 16/12 cm H2O  . Hypertension   . GERD (gastroesophageal reflux disease)     with Barretts esophagus  . Allergic rhinitis   . Psoriasis   . Onychomycosis   . Morton's neuroma     Left  . Osteoarthritis, shoulder     right  . Macular degeneration, bilateral   . TMJ syndrome     Left>right  . Legally blind   . Arthritis     Current Outpatient Prescriptions  Medication Sig Dispense Refill  . adalimumab (HUMIRA PEN) 40 MG/0.8ML injection Inject 40 mg into the skin every 14 (fourteen) days.    Marland Kitchen aspirin 81 MG tablet Take 81 mg by mouth daily as needed for pain.    . cetirizine (ZYRTEC) 10 MG tablet Take 10 mg by mouth daily.    . fluticasone (FLONASE) 50 MCG/ACT nasal spray Place 2 sprays into both nostrils daily.    . Glucosamine 500 MG CAPS Take 1 capsule by mouth 2 (two) times daily.    Marland Kitchen glucosamine-chondroitin 500-400 MG tablet Take 1 tablet by mouth 3 (three) times daily.    Marland Kitchen lisinopril (PRINIVIL,ZESTRIL) 40 MG tablet Take 40 mg by mouth daily.    . Multiple Vitamins-Minerals (VITEYES AREDS  FORMULA/LUTEIN PO) Take by mouth.    . pantoprazole (PROTONIX) 40 MG tablet Take 40 mg by mouth daily.    . vitamin B-12 (CYANOCOBALAMIN) 100 MCG tablet Take 100 mcg by mouth daily.     No current facility-administered medications for this visit.    Allergies:    Allergies  Allergen Reactions  . Excedrin Extra Strength [Aspirin-Acetaminophen-Caffeine] Itching    Social History:  The patient  reports that she has never smoked. She does not have any smokeless tobacco history on file. She reports that she does not drink alcohol or use illicit drugs.   Family History:  The patient's family history is not on file.   ROS:  Please see the history of present illness.      All other systems reviewed and negative.   PHYSICAL EXAM: VS:  BP 142/80 mmHg  Pulse 97  Ht 5' 2.5" (1.588 m)  Wt 298 lb 3.2 oz (135.263 kg)  BMI 53.64 kg/m2 Well nourished, well developed, in no acute distress HEENT: normal Neck: no JVD Cardiac:  normal S1, S2; RRR; no murmur Lungs:  clear to auscultation bilaterally, no wheezing, rhonchi or rales Abd: soft, nontender, no hepatomegaly Ext: no edema Skin: warm and dry  Neuro:  CNs 2-12 intact, no focal abnormalities noted  ASSESSMENT AND PLAN:  1. Severe OSA on BiPAP - I will get a download from DME 2. HTN - controlled - continue Lisinopril 3. Obesity - I have encouarged her to continue her exercise regimen and follow a low fat diet.  I encouraged her to increase exercise to 45 minutes 5 days weekly to try to lose the 9 pounds she has gained since I saw her last  Followup with me in 6 months        Signed, Fransico Him, MD [facility] 05/15/2014 1:37 PM

## 2014-05-15 NOTE — Patient Instructions (Signed)
Your physician wants you to follow-up in: 6 months with Dr. Turner. You will receive a reminder letter in the mail two months in advance. If you don't receive a letter, please call our office to schedule the follow-up appointment.  Your physician recommends that you continue on your current medications as directed. Please refer to the Current Medication list given to you today.  

## 2014-07-10 DIAGNOSIS — H3553 Other dystrophies primarily involving the sensory retina: Secondary | ICD-10-CM | POA: Diagnosis not present

## 2014-07-10 DIAGNOSIS — H3531 Nonexudative age-related macular degeneration: Secondary | ICD-10-CM | POA: Diagnosis not present

## 2014-07-11 DIAGNOSIS — K219 Gastro-esophageal reflux disease without esophagitis: Secondary | ICD-10-CM | POA: Diagnosis not present

## 2014-07-11 DIAGNOSIS — Z86010 Personal history of colonic polyps: Secondary | ICD-10-CM | POA: Diagnosis not present

## 2014-07-11 DIAGNOSIS — K2270 Barrett's esophagus without dysplasia: Secondary | ICD-10-CM | POA: Diagnosis not present

## 2014-07-11 DIAGNOSIS — Z79899 Other long term (current) drug therapy: Secondary | ICD-10-CM | POA: Diagnosis not present

## 2014-08-20 ENCOUNTER — Encounter: Payer: Self-pay | Admitting: Cardiology

## 2014-09-05 DIAGNOSIS — K2270 Barrett's esophagus without dysplasia: Secondary | ICD-10-CM | POA: Diagnosis not present

## 2014-09-05 DIAGNOSIS — E119 Type 2 diabetes mellitus without complications: Secondary | ICD-10-CM | POA: Diagnosis not present

## 2014-09-05 DIAGNOSIS — I10 Essential (primary) hypertension: Secondary | ICD-10-CM | POA: Diagnosis not present

## 2014-09-05 DIAGNOSIS — E559 Vitamin D deficiency, unspecified: Secondary | ICD-10-CM | POA: Diagnosis not present

## 2014-09-05 DIAGNOSIS — K219 Gastro-esophageal reflux disease without esophagitis: Secondary | ICD-10-CM | POA: Diagnosis not present

## 2014-11-07 DIAGNOSIS — Z79899 Other long term (current) drug therapy: Secondary | ICD-10-CM | POA: Diagnosis not present

## 2014-11-07 DIAGNOSIS — L400 Psoriasis vulgaris: Secondary | ICD-10-CM | POA: Diagnosis not present

## 2014-11-07 DIAGNOSIS — Z85828 Personal history of other malignant neoplasm of skin: Secondary | ICD-10-CM | POA: Diagnosis not present

## 2014-12-03 ENCOUNTER — Encounter: Payer: Self-pay | Admitting: *Deleted

## 2014-12-04 NOTE — Progress Notes (Signed)
Cardiology Office Note   Date:  12/05/2014   ID:  Kathryn Pittman, DOB May 15, 1956, MRN [id number]  PCP:  Gara Kroner, MD    Chief Complaint  Patient presents with  . Follow-up    OSA      History of Present Illness: Kathryn Pittman is a 59 y.o. female with a history of severe OSA on BiPAP, HTN and obesity. She is doing well. She tolerates her BiPAP without any problems. She tolerates her full face mask and feels the pressure is adequate. She denies any nasal dryness or congestion. She occasionally has some mild mouth dryness.  She feels rested in the am and has no daytime sleepiness.She does not snore. She works out on the elliptical and stationary bike.  She exercises 30 minutes daily and sometimes she walks.  She has lost 10 lbs.       Past Medical History  Diagnosis Date  . OSA (obstructive sleep apnea)     on BiPAP 16/12 cm H2O  . Hypertension   . GERD (gastroesophageal reflux disease)     with Barretts esophagus  . Allergic rhinitis   . Psoriasis   . Onychomycosis   . Morton's neuroma     Left  . Osteoarthritis, shoulder     right  . Macular degeneration, bilateral   . TMJ syndrome     Left>right  . Legally blind   . Arthritis     History reviewed. No pertinent past surgical history.   Current Outpatient Prescriptions  Medication Sig Dispense Refill  . adalimumab (HUMIRA PEN) 40 MG/0.8ML injection Inject 40 mg into the skin every 14 (fourteen) days.    Marland Kitchen aspirin 81 MG tablet Take 81 mg by mouth daily as needed for pain.    . cetirizine (ZYRTEC) 10 MG tablet Take 10 mg by mouth daily.    . fluticasone (FLONASE) 50 MCG/ACT nasal spray Place 2 sprays into both nostrils daily.    Marland Kitchen glucosamine-chondroitin 500-400 MG tablet Take 1 tablet by mouth daily.     Marland Kitchen lisinopril (PRINIVIL,ZESTRIL) 40 MG tablet Take 40 mg by mouth daily.    . Multiple Vitamins-Minerals (VITEYES AREDS FORMULA/LUTEIN PO) Take by mouth.    . pantoprazole (PROTONIX)  40 MG tablet Take 40 mg by mouth daily.    . vitamin B-12 (CYANOCOBALAMIN) 100 MCG tablet Take 100 mcg by mouth daily.     No current facility-administered medications for this visit.    Allergies:   Excedrin extra strength    Social History:  The patient  reports that she has never smoked. She does not have any smokeless tobacco history on file. She reports that she does not drink alcohol or use illicit drugs.   Family History:  The patient's family history includes Heart attack in her mother.    ROS:  Please see the history of present illness.   Otherwise, review of systems are positive for none.   All other systems are reviewed and negative.    PHYSICAL EXAM: VS:  BP 114/86 mmHg  Pulse 89  Ht 5' 2.5" (1.588 m)  Wt 288 lb 1.9 oz (130.69 kg)  BMI 51.83 kg/m2  SpO2 97% , BMI Body mass index is 51.83 kg/(m^2). GEN: Well nourished, well developed, in no acute distress HEENT: normal Neck: no JVD, carotid bruits, or masses Cardiac: RRR; no murmurs, rubs, or gallops,no edema  Respiratory:  clear to  auscultation bilaterally, normal work of breathing GI: soft, nontender, nondistended, + BS MS: no deformity or atrophy Skin: warm and dry, no rash Neuro:  Strength and sensation are intact Psych: euthymic mood, full affect   EKG:  EKG is not ordered today.    Recent Labs: No results found for requested labs within last 365 days.    Lipid Panel No results found for: CHOL, TRIG, HDL, CHOLHDL, VLDL, LDLCALC, LDLDIRECT    Wt Readings from Last 3 Encounters:  12/05/14 288 lb 1.9 oz (130.69 kg)  05/15/14 298 lb 3.2 oz (135.263 kg)  06/25/13 289 lb (131.09 kg)    ASSESSMENT AND PLAN:  1. Severe OSA on BiPAP - I will get a download from DME.  She sleeps on her side at night.  2. HTN - controlled - continue Lisinopril   3.       Obesity - I have encouarged her to continue her exercise regimen and follow a low fat diet.  She has lost 10 lbs and I congratulated her.       Current medicines are reviewed at length with the patient today.  The patient does not have concerns regarding medicines.  The following changes have been made:  no change  Labs/ tests ordered today: See above Assessment and Plan No orders of the defined types were placed in this encounter.     Disposition:   FU with me in 1 year  Signed, Sueanne Margarita, MD  12/05/2014 11:04 AM    [facility] [place], [place], [place]  [address] Phone: [phone]; Fax: [phone]

## 2014-12-05 ENCOUNTER — Ambulatory Visit: Payer: Medicare Other | Admitting: Cardiology

## 2014-12-05 ENCOUNTER — Ambulatory Visit (INDEPENDENT_AMBULATORY_CARE_PROVIDER_SITE_OTHER): Payer: 59 | Admitting: Cardiology

## 2014-12-05 ENCOUNTER — Encounter: Payer: Self-pay | Admitting: Cardiology

## 2014-12-05 VITALS — BP 114/86 | HR 89 | Ht 62.5 in | Wt 288.1 lb

## 2014-12-05 DIAGNOSIS — G4733 Obstructive sleep apnea (adult) (pediatric): Secondary | ICD-10-CM | POA: Diagnosis not present

## 2014-12-05 DIAGNOSIS — Z1389 Encounter for screening for other disorder: Secondary | ICD-10-CM | POA: Diagnosis not present

## 2014-12-05 DIAGNOSIS — H548 Legal blindness, as defined in USA: Secondary | ICD-10-CM | POA: Diagnosis not present

## 2014-12-05 DIAGNOSIS — E119 Type 2 diabetes mellitus without complications: Secondary | ICD-10-CM | POA: Diagnosis not present

## 2014-12-05 DIAGNOSIS — I10 Essential (primary) hypertension: Secondary | ICD-10-CM | POA: Diagnosis not present

## 2014-12-05 DIAGNOSIS — L409 Psoriasis, unspecified: Secondary | ICD-10-CM | POA: Diagnosis not present

## 2014-12-05 DIAGNOSIS — E559 Vitamin D deficiency, unspecified: Secondary | ICD-10-CM | POA: Diagnosis not present

## 2014-12-05 DIAGNOSIS — Z1211 Encounter for screening for malignant neoplasm of colon: Secondary | ICD-10-CM | POA: Diagnosis not present

## 2014-12-05 DIAGNOSIS — K219 Gastro-esophageal reflux disease without esophagitis: Secondary | ICD-10-CM | POA: Diagnosis not present

## 2014-12-05 DIAGNOSIS — K2270 Barrett's esophagus without dysplasia: Secondary | ICD-10-CM | POA: Diagnosis not present

## 2014-12-05 DIAGNOSIS — J309 Allergic rhinitis, unspecified: Secondary | ICD-10-CM | POA: Diagnosis not present

## 2014-12-05 DIAGNOSIS — Z0000 Encounter for general adult medical examination without abnormal findings: Secondary | ICD-10-CM | POA: Diagnosis not present

## 2014-12-05 NOTE — Patient Instructions (Signed)

## 2014-12-22 ENCOUNTER — Encounter: Payer: Self-pay | Admitting: Cardiology

## 2015-01-20 DIAGNOSIS — M1711 Unilateral primary osteoarthritis, right knee: Secondary | ICD-10-CM | POA: Diagnosis not present

## 2015-01-20 DIAGNOSIS — S8001XA Contusion of right knee, initial encounter: Secondary | ICD-10-CM | POA: Diagnosis not present

## 2015-03-12 DIAGNOSIS — H00012 Hordeolum externum right lower eyelid: Secondary | ICD-10-CM | POA: Diagnosis not present

## 2015-06-18 ENCOUNTER — Telehealth: Payer: Self-pay | Admitting: *Deleted

## 2015-06-18 DIAGNOSIS — G4733 Obstructive sleep apnea (adult) (pediatric): Secondary | ICD-10-CM

## 2015-06-18 NOTE — Telephone Encounter (Signed)
Patient is in need of a new BiPAP machine from [place].  They said that they need a signed order from Dr. Radford Pax faxed. She is currently on 17cm H2O.   Will get a signed order and fax to [facility]

## 2015-06-19 DIAGNOSIS — H548 Legal blindness, as defined in USA: Secondary | ICD-10-CM | POA: Diagnosis not present

## 2015-06-19 DIAGNOSIS — K2270 Barrett's esophagus without dysplasia: Secondary | ICD-10-CM | POA: Diagnosis not present

## 2015-06-19 DIAGNOSIS — G4733 Obstructive sleep apnea (adult) (pediatric): Secondary | ICD-10-CM | POA: Diagnosis not present

## 2015-06-19 DIAGNOSIS — I10 Essential (primary) hypertension: Secondary | ICD-10-CM | POA: Diagnosis not present

## 2015-06-19 DIAGNOSIS — L409 Psoriasis, unspecified: Secondary | ICD-10-CM | POA: Diagnosis not present

## 2015-06-19 DIAGNOSIS — K219 Gastro-esophageal reflux disease without esophagitis: Secondary | ICD-10-CM | POA: Diagnosis not present

## 2015-06-19 DIAGNOSIS — I781 Nevus, non-neoplastic: Secondary | ICD-10-CM | POA: Diagnosis not present

## 2015-06-19 DIAGNOSIS — E119 Type 2 diabetes mellitus without complications: Secondary | ICD-10-CM | POA: Diagnosis not present

## 2015-06-19 DIAGNOSIS — E559 Vitamin D deficiency, unspecified: Secondary | ICD-10-CM | POA: Diagnosis not present

## 2015-06-19 DIAGNOSIS — J309 Allergic rhinitis, unspecified: Secondary | ICD-10-CM | POA: Diagnosis not present

## 2015-06-30 ENCOUNTER — Telehealth: Payer: Self-pay | Admitting: Cardiology

## 2015-06-30 NOTE — Telephone Encounter (Signed)
New Message   Pt calling because she needs Dr. Radford Pax to write out a letter stating   that pt needs her sleep machine and that she has been using it for her insurance

## 2015-07-02 NOTE — Telephone Encounter (Signed)
We have received a fax from [employer]. Will place on Dr. Theodosia Blender cart to sign as soon as she is back in the office

## 2015-07-02 NOTE — Telephone Encounter (Signed)
Patient is aware that I will fax paper as soon as it is signed

## 2015-07-08 NOTE — Telephone Encounter (Signed)
Paperwork has been faxed to [employer]

## 2015-07-09 DIAGNOSIS — H2511 Age-related nuclear cataract, right eye: Secondary | ICD-10-CM | POA: Diagnosis not present

## 2015-07-09 DIAGNOSIS — H2512 Age-related nuclear cataract, left eye: Secondary | ICD-10-CM | POA: Diagnosis not present

## 2015-07-09 DIAGNOSIS — H3553 Other dystrophies primarily involving the sensory retina: Secondary | ICD-10-CM | POA: Diagnosis not present

## 2015-07-09 DIAGNOSIS — H353132 Nonexudative age-related macular degeneration, bilateral, intermediate dry stage: Secondary | ICD-10-CM | POA: Diagnosis not present

## 2015-07-31 DIAGNOSIS — J019 Acute sinusitis, unspecified: Secondary | ICD-10-CM | POA: Diagnosis not present

## 2015-09-25 DIAGNOSIS — K210 Gastro-esophageal reflux disease with esophagitis: Secondary | ICD-10-CM | POA: Diagnosis not present

## 2015-09-25 DIAGNOSIS — K2270 Barrett's esophagus without dysplasia: Secondary | ICD-10-CM | POA: Diagnosis not present

## 2015-09-25 DIAGNOSIS — Z86010 Personal history of colonic polyps: Secondary | ICD-10-CM | POA: Diagnosis not present

## 2015-10-01 ENCOUNTER — Ambulatory Visit (INDEPENDENT_AMBULATORY_CARE_PROVIDER_SITE_OTHER): Payer: 59 | Admitting: Cardiology

## 2015-10-01 ENCOUNTER — Encounter: Payer: Self-pay | Admitting: Cardiology

## 2015-10-01 ENCOUNTER — Ambulatory Visit: Payer: 59 | Admitting: Cardiology

## 2015-10-01 VITALS — BP 134/78 | HR 95 | Ht 62.5 in | Wt 301.4 lb

## 2015-10-01 DIAGNOSIS — I10 Essential (primary) hypertension: Secondary | ICD-10-CM | POA: Diagnosis not present

## 2015-10-01 DIAGNOSIS — G4733 Obstructive sleep apnea (adult) (pediatric): Secondary | ICD-10-CM

## 2015-10-01 NOTE — Patient Instructions (Signed)
Medication Instructions:  Your physician recommends that you continue on your current medications as directed. Please refer to the Current Medication list given to you today.   Labwork: None  Testing/Procedures: Dr. Radford Pax recommends you have a CPAP titration.  Follow-Up: Your physician wants you to follow-up in: 1 year with Dr. Radford Pax. You will receive a reminder letter in the mail two months in advance. If you don't receive a letter, please call our office to schedule the follow-up appointment.   Any Other Special Instructions Will Be Listed Below (If Applicable).     If you need a refill on your cardiac medications before your next appointment, please call your pharmacy.

## 2015-10-01 NOTE — Progress Notes (Signed)
Cardiology Office Note   Date:  10/01/2015   ID:  TREVON MISE, DOB 1956/02/20, MRN [id number]  PCP:  Gara Kroner, MD    Chief Complaint  Patient presents with  . Sleep Apnea  . Hypertension      History of Present Illness: Kathryn Pittman is a 60 y.o. female with a history of severe OSA on BiPAP, HTN and obesity. She is doing well. She tolerates her BiPAP but her machine is not working well and she wants a new machine.  It keeps cutting off. She tolerates her full face mask and feels the pressure is adequate. She is waking up in the middle of the night and is not sure it is working correctly.  She denies any nasal dryness or congestion. She occasionally has some mild mouth dryness. She has been feeling tired in the am recently and has daytime sleepiness.She does not snore. She works out on the elliptical and stationary bike. She exercises 30 minutes daily and sometimes she walks. She has gained 13 lbs since I saw her last.   Past Medical History  Diagnosis Date  . OSA (obstructive sleep apnea)     on BiPAP 16/12 cm H2O  . Hypertension   . GERD (gastroesophageal reflux disease)     with Barretts esophagus  . Allergic rhinitis   . Psoriasis   . Onychomycosis   . Morton's neuroma     Left  . Osteoarthritis, shoulder     right  . Macular degeneration, bilateral   . TMJ syndrome     Left>right  . Legally blind   . Arthritis     No past surgical history on file.   Current Outpatient Prescriptions  Medication Sig Dispense Refill  . adalimumab (HUMIRA PEN) 40 MG/0.8ML injection Inject 40 mg into the skin every 14 (fourteen) days.    Marland Kitchen aspirin 81 MG tablet Take 81 mg by mouth daily.    . cetirizine (ZYRTEC) 10 MG tablet Take 10 mg by mouth daily.    . fluticasone (FLONASE) 50 MCG/ACT nasal spray Place 2 sprays into both nostrils daily as needed for allergies.     Marland Kitchen glucosamine-chondroitin 500-400 MG tablet Take 1 tablet by mouth daily.       Marland Kitchen lisinopril (PRINIVIL,ZESTRIL) 40 MG tablet Take 40 mg by mouth daily.    . Multiple Vitamins-Minerals (VITEYES AREDS FORMULA/LUTEIN PO) Take 1 tablet by mouth daily.     . pantoprazole (PROTONIX) 40 MG tablet Take 40 mg by mouth daily.    . vitamin B-12 (CYANOCOBALAMIN) 100 MCG tablet Take 100 mcg by mouth daily.     No current facility-administered medications for this visit.    Allergies:   Excedrin extra strength    Social History:  The patient  reports that she has never smoked. She has never used smokeless tobacco. She reports that she does not drink alcohol or use illicit drugs.   Family History:  The patient's family history includes Heart attack in her mother.    ROS:  Please see the history of present illness.   Otherwise, review of systems are positive for none.   All other systems are reviewed and negative.    PHYSICAL EXAM: VS:  BP 134/78 mmHg  Pulse 95  Ht 5' 2.5" (1.588 m)  Wt 301 lb 6.4 oz (136.714 kg)  BMI 54.21 kg/m2 , BMI Body mass index is 54.21  kg/(m^2). GEN: Well nourished, well developed, in no acute distress HEENT: normal Neck: no JVD, carotid bruits, or masses Cardiac: RRR; no murmurs, rubs, or gallops,no edema  Respiratory:  clear to auscultation bilaterally, normal work of breathing GI: soft, nontender, nondistended, + BS MS: no deformity or atrophy Skin: warm and dry, no rash Neuro:  Strength and sensation are intact Psych: euthymic mood, full affect   EKG:  EKG is not ordered today.    Recent Labs: No results found for requested labs within last 365 days.    Lipid Panel No results found for: CHOL, TRIG, HDL, CHOLHDL, VLDL, LDLCALC, LDLDIRECT    Wt Readings from Last 3 Encounters:  10/01/15 301 lb 6.4 oz (136.714 kg)  12/05/14 288 lb 1.9 oz (130.69 kg)  05/15/14 298 lb 3.2 oz (135.263 kg)    ASSESSMENT AND PLAN:  1. Severe OSA on BiPAP - She is having problems with increased daytime sleepiness and waking up at night.  Since she  needs a new machine I will get a CPAP titration in the lab to make sure she is on the correct pressure and go ahead and order her a ResMed BiPAP unit at current pressure of 17/13cm H2O until she can get a new titration.    2. HTN - controlled - continue Lisinopril 3.Obesity - I have encouarged her to increase her aerobic exercise to 1 hour daily and consider joining weight watchers since she has gained weight.      Current medicines are reviewed at length with the patient today.  The patient does not have concerns regarding medicines.  The following changes have been made:  no change  Labs/ tests ordered today: See above Assessment and Plan No orders of the defined types were placed in this encounter.     Disposition:   FU with me in 1 year  Signed, Sueanne Margarita, MD  10/01/2015 11:21 AM    [facility] [place], [place], [place]  [address] Phone: [phone]; Fax: [phone]

## 2015-10-13 ENCOUNTER — Other Ambulatory Visit: Payer: Self-pay | Admitting: *Deleted

## 2015-10-13 ENCOUNTER — Telehealth: Payer: Self-pay | Admitting: *Deleted

## 2015-10-13 DIAGNOSIS — G4733 Obstructive sleep apnea (adult) (pediatric): Secondary | ICD-10-CM

## 2015-10-13 NOTE — Telephone Encounter (Signed)
Titration pending Biochemist, clinical    Ref # [id number]

## 2015-10-15 NOTE — Telephone Encounter (Signed)
Clinicals faxed per [facility] request

## 2015-10-23 DIAGNOSIS — L814 Other melanin hyperpigmentation: Secondary | ICD-10-CM | POA: Diagnosis not present

## 2015-10-23 DIAGNOSIS — D2272 Melanocytic nevi of left lower limb, including hip: Secondary | ICD-10-CM | POA: Diagnosis not present

## 2015-10-23 DIAGNOSIS — L400 Psoriasis vulgaris: Secondary | ICD-10-CM | POA: Diagnosis not present

## 2015-10-23 DIAGNOSIS — D225 Melanocytic nevi of trunk: Secondary | ICD-10-CM | POA: Diagnosis not present

## 2015-10-23 DIAGNOSIS — L918 Other hypertrophic disorders of the skin: Secondary | ICD-10-CM | POA: Diagnosis not present

## 2015-10-23 DIAGNOSIS — Z85828 Personal history of other malignant neoplasm of skin: Secondary | ICD-10-CM | POA: Diagnosis not present

## 2015-10-23 DIAGNOSIS — Z79899 Other long term (current) drug therapy: Secondary | ICD-10-CM | POA: Diagnosis not present

## 2015-10-23 DIAGNOSIS — D1801 Hemangioma of skin and subcutaneous tissue: Secondary | ICD-10-CM | POA: Diagnosis not present

## 2015-10-23 DIAGNOSIS — L821 Other seborrheic keratosis: Secondary | ICD-10-CM | POA: Diagnosis not present

## 2015-10-23 NOTE — Telephone Encounter (Signed)
Approval still pending, per [employer] 

## 2015-10-27 NOTE — Telephone Encounter (Signed)
[facility] has denied in lab titration.      BiPAP machine was ordered based on office visit, but Dr. Radford Pax still wanted a BiPAP titration.   Routed to Dr. Radford Pax to be sure that patient is okay with BiPAP ordered 17/13 cm H2O.

## 2015-10-27 NOTE — Telephone Encounter (Signed)
Please order a 2 week auto Bipap titration from 5 to 18cm H2O

## 2015-10-27 NOTE — Telephone Encounter (Signed)
Please try again and let them know that she is having daytime sleepiness on current BiPAP settings and needs in lab titration.

## 2015-10-27 NOTE — Telephone Encounter (Signed)
Per [facility], the only way this could possibly be approved would be with a Peer to Peer review.  The option for peer to peer review expires 14 days from today (10/27/15).   Phone number for [employer] is [phone], Case/Reference number [id number]

## 2015-10-29 NOTE — Telephone Encounter (Signed)
Orders will be placed and sent to

## 2015-10-29 NOTE — Telephone Encounter (Signed)
Orders will be placed and sent to [facility]

## 2015-10-29 NOTE — Addendum Note (Signed)
Addended by: Andres Ege on: 10/29/2015 11:34 AM   Modules accepted: Orders

## 2016-02-10 ENCOUNTER — Encounter: Payer: Self-pay | Admitting: Cardiology

## 2016-02-16 ENCOUNTER — Encounter: Payer: Self-pay | Admitting: Cardiology

## 2016-02-26 ENCOUNTER — Encounter: Payer: Self-pay | Admitting: Cardiology

## 2016-02-26 ENCOUNTER — Ambulatory Visit: Payer: 59 | Admitting: Cardiology

## 2016-03-10 NOTE — Progress Notes (Signed)
Cardiology Office Note    Date:  03/11/2016   ID:  Kathryn Pittman, DOB [birth year], MRN [id number]  PCP:  Gara Kroner, MD  Cardiologist:  Fransico Him, MD   Chief Complaint  Patient presents with  . Sleep Apnea  . Hypertension    History of Present Illness:  Kathryn Pittman is a 60 y.o. female with a history of severe OSA on BiPAP, HTN and obesity. She is doing well. She tolerates her BiPAP.  She tolerates her full face mask and feels the pressure is adequate.  She denies any significant nasal dryness or congestion. She occasionally has some mild mouth dryness. She does not snore. She feels rested in the am and has no daytime sleepiness and feels much better on the BiPAP.  She works out on the elliptical and stationary bike. She exercises 30 minutes daily and sometimes she walks. She has lost 7lbs since I saw her last.  Past Medical History:  Diagnosis Date  . Allergic rhinitis   . Arthritis   . GERD (gastroesophageal reflux disease)    with Barretts esophagus  . Hypertension   . Legally blind   . Macular degeneration, bilateral   . Morton's neuroma    Left  . Onychomycosis   . OSA (obstructive sleep apnea)    on BiPAP 16/12 cm H2O  . Osteoarthritis, shoulder    right  . Psoriasis   . TMJ syndrome    Left>right    History reviewed. No pertinent surgical history.  Current Medications: Outpatient Medications Prior to Visit  Medication Sig Dispense Refill  . adalimumab (HUMIRA PEN) 40 MG/0.8ML injection Inject 40 mg into the skin every 14 (fourteen) days.    Marland Kitchen aspirin 81 MG tablet Take 81 mg by mouth daily.    . cetirizine (ZYRTEC) 10 MG tablet Take 10 mg by mouth daily.    . fluticasone (FLONASE) 50 MCG/ACT nasal spray Place 2 sprays into both nostrils daily as needed for allergies.     Marland Kitchen lisinopril (PRINIVIL,ZESTRIL) 40 MG tablet Take 40 mg by mouth daily.    . Multiple Vitamins-Minerals (VITEYES AREDS FORMULA/LUTEIN PO) Take 1 tablet by mouth daily.     .  pantoprazole (PROTONIX) 40 MG tablet Take 40 mg by mouth daily.    . vitamin B-12 (CYANOCOBALAMIN) 100 MCG tablet Take 100 mcg by mouth daily.    Marland Kitchen glucosamine-chondroitin 500-400 MG tablet Take 1 tablet by mouth daily.      No facility-administered medications prior to visit.      Allergies:   Excedrin extra strength [aspirin-acetaminophen-caffeine]   Social History   Social History  . Marital status: Married    Spouse name: N/A  . Number of children: N/A  . Years of education: N/A   Social History Main Topics  . Smoking status: Never Smoker  . Smokeless tobacco: Never Used  . Alcohol use No  . Drug use: No  . Sexual activity: Not Asked   Other Topics Concern  . None   Social History Narrative  . None     Family History:  The patient's family history includes Heart attack in her mother.   ROS:   Please see the history of present illness.    ROS All other systems reviewed and are negative.   PHYSICAL EXAM:   VS:  BP 130/70   Pulse 98   Ht 5' 2.5" (1.588 m)   Wt 294 lb 1.9 oz (133.4 kg)   BMI 52.94 kg/m  GEN: Well nourished, well developed, in no acute distress  HEENT: normal  Neck: no JVD, carotid bruits, or masses Cardiac: RRR; no murmurs, rubs, or gallops,no edema.  Intact distal pulses bilaterally.  Respiratory:  clear to auscultation bilaterally, normal work of breathing GI: soft, nontender, nondistended, + BS MS: no deformity or atrophy  Skin: warm and dry, no rash Neuro:  Alert and Oriented x 3, Strength and sensation are intact Psych: euthymic mood, full affect  Wt Readings from Last 3 Encounters:  03/11/16 294 lb 1.9 oz (133.4 kg)  10/01/15 (!) 301 lb 6.4 oz (136.7 kg)  12/05/14 288 lb 1.9 oz (130.7 kg)      Studies/Labs Reviewed:   EKG:  EKG is not ordered today.    Recent Labs: No results found for requested labs within last 8760 hours.   Lipid Panel No results found for: CHOL, TRIG, HDL, CHOLHDL, VLDL, LDLCALC,  LDLDIRECT  Additional studies/ records that were reviewed today include:  CPAP d/l    ASSESSMENT:    1. OSA (obstructive sleep apnea)   2. Essential hypertension, benign   3. Morbid obesity, unspecified obesity type ([place])      PLAN:  In order of problems listed above:  OSA - the patient is tolerating PAP therapy well without any problems. The PAP download was reviewed today and showed an AHI of 3.2/hr on 17/13 cm H2O with 100% compliance in using more than 4 hours nightly.  The patient has been using and benefiting from CPAP use and will continue to benefit from therapy.  2.   HTN - BP controlled on current meds.  Continue ACE I 3.   Obesity - I have encouraged him to continue in her routine exercise program and cut back on carbs and portions. I have recommended that she try the Mediterranean diet.     Medication Adjustments/Labs and Tests Ordered: Current medicines are reviewed at length with the patient today.  Concerns regarding medicines are outlined above.  Medication changes, Labs and Tests ordered today are listed in the Patient Instructions below.  There are no Patient Instructions on file for this visit.   Signed, Fransico Him, MD  03/11/2016 11:18 AM    [facility] [place], [place], [place]  [address] Phone: [phone]; Fax: [phone]

## 2016-03-11 ENCOUNTER — Ambulatory Visit (INDEPENDENT_AMBULATORY_CARE_PROVIDER_SITE_OTHER): Payer: 59 | Admitting: Cardiology

## 2016-03-11 ENCOUNTER — Encounter: Payer: Self-pay | Admitting: Cardiology

## 2016-03-11 VITALS — BP 130/70 | HR 98 | Ht 62.5 in | Wt 294.1 lb

## 2016-03-11 DIAGNOSIS — G4733 Obstructive sleep apnea (adult) (pediatric): Secondary | ICD-10-CM | POA: Diagnosis not present

## 2016-03-11 DIAGNOSIS — I10 Essential (primary) hypertension: Secondary | ICD-10-CM

## 2016-03-11 NOTE — Patient Instructions (Signed)

## 2016-05-25 DIAGNOSIS — N390 Urinary tract infection, site not specified: Secondary | ICD-10-CM | POA: Diagnosis not present

## 2016-05-25 DIAGNOSIS — M545 Low back pain: Secondary | ICD-10-CM | POA: Diagnosis not present

## 2016-06-17 DIAGNOSIS — H548 Legal blindness, as defined in USA: Secondary | ICD-10-CM | POA: Diagnosis not present

## 2016-06-17 DIAGNOSIS — L409 Psoriasis, unspecified: Secondary | ICD-10-CM | POA: Diagnosis not present

## 2016-06-17 DIAGNOSIS — K2270 Barrett's esophagus without dysplasia: Secondary | ICD-10-CM | POA: Diagnosis not present

## 2016-06-17 DIAGNOSIS — E559 Vitamin D deficiency, unspecified: Secondary | ICD-10-CM | POA: Diagnosis not present

## 2016-06-17 DIAGNOSIS — J309 Allergic rhinitis, unspecified: Secondary | ICD-10-CM | POA: Diagnosis not present

## 2016-06-17 DIAGNOSIS — Z7984 Long term (current) use of oral hypoglycemic drugs: Secondary | ICD-10-CM | POA: Diagnosis not present

## 2016-06-17 DIAGNOSIS — I10 Essential (primary) hypertension: Secondary | ICD-10-CM | POA: Diagnosis not present

## 2016-06-17 DIAGNOSIS — G4733 Obstructive sleep apnea (adult) (pediatric): Secondary | ICD-10-CM | POA: Diagnosis not present

## 2016-06-17 DIAGNOSIS — E119 Type 2 diabetes mellitus without complications: Secondary | ICD-10-CM | POA: Diagnosis not present

## 2016-06-17 DIAGNOSIS — K219 Gastro-esophageal reflux disease without esophagitis: Secondary | ICD-10-CM | POA: Diagnosis not present

## 2016-06-23 ENCOUNTER — Other Ambulatory Visit: Payer: Self-pay | Admitting: Gastroenterology

## 2016-06-23 ENCOUNTER — Encounter (HOSPITAL_COMMUNITY): Payer: Self-pay | Admitting: *Deleted

## 2016-06-23 NOTE — Progress Notes (Signed)
Spoke with pt for pre-op call. Pt denies cardiac history, chest pain or sob. Pt has not had an EKG in the past 2 years.    Echo - 05/26/10 - in EPIC

## 2016-06-24 ENCOUNTER — Encounter (HOSPITAL_COMMUNITY)
Admission: RE | Disposition: A | Discharge status: Home / Self Care | Payer: Self-pay | Source: Ambulatory Visit | Attending: Gastroenterology

## 2016-06-24 ENCOUNTER — Ambulatory Visit (HOSPITAL_COMMUNITY): Payer: 59 | Admitting: Certified Registered"

## 2016-06-24 ENCOUNTER — Ambulatory Visit (HOSPITAL_COMMUNITY)
Admission: RE | Admit: 2016-06-24 | Discharge: 2016-06-24 | Disposition: A | Discharge status: Home / Self Care | Payer: 59 | Source: Ambulatory Visit | Attending: Gastroenterology | Admitting: Gastroenterology

## 2016-06-24 ENCOUNTER — Encounter (HOSPITAL_COMMUNITY): Payer: Self-pay | Admitting: *Deleted

## 2016-06-24 DIAGNOSIS — G4733 Obstructive sleep apnea (adult) (pediatric): Secondary | ICD-10-CM | POA: Insufficient documentation

## 2016-06-24 DIAGNOSIS — K2270 Barrett's esophagus without dysplasia: Secondary | ICD-10-CM | POA: Diagnosis not present

## 2016-06-24 DIAGNOSIS — D120 Benign neoplasm of cecum: Secondary | ICD-10-CM | POA: Insufficient documentation

## 2016-06-24 DIAGNOSIS — D123 Benign neoplasm of transverse colon: Secondary | ICD-10-CM | POA: Diagnosis not present

## 2016-06-24 DIAGNOSIS — Z6841 Body Mass Index (BMI) 40.0 and over, adult: Secondary | ICD-10-CM | POA: Insufficient documentation

## 2016-06-24 DIAGNOSIS — Z86010 Personal history of colonic polyps: Secondary | ICD-10-CM | POA: Diagnosis not present

## 2016-06-24 DIAGNOSIS — Z7982 Long term (current) use of aspirin: Secondary | ICD-10-CM | POA: Diagnosis not present

## 2016-06-24 DIAGNOSIS — I10 Essential (primary) hypertension: Secondary | ICD-10-CM | POA: Insufficient documentation

## 2016-06-24 DIAGNOSIS — Z09 Encounter for follow-up examination after completed treatment for conditions other than malignant neoplasm: Secondary | ICD-10-CM | POA: Diagnosis present

## 2016-06-24 DIAGNOSIS — Z79899 Other long term (current) drug therapy: Secondary | ICD-10-CM | POA: Insufficient documentation

## 2016-06-24 DIAGNOSIS — K644 Residual hemorrhoidal skin tags: Secondary | ICD-10-CM | POA: Diagnosis not present

## 2016-06-24 DIAGNOSIS — K219 Gastro-esophageal reflux disease without esophagitis: Secondary | ICD-10-CM | POA: Diagnosis not present

## 2016-06-24 DIAGNOSIS — Z1211 Encounter for screening for malignant neoplasm of colon: Secondary | ICD-10-CM | POA: Diagnosis not present

## 2016-06-24 DIAGNOSIS — Z9989 Dependence on other enabling machines and devices: Secondary | ICD-10-CM | POA: Diagnosis not present

## 2016-06-24 DIAGNOSIS — K635 Polyp of colon: Secondary | ICD-10-CM | POA: Diagnosis not present

## 2016-06-24 HISTORY — DX: Personal history of other diseases of the digestive system: Z87.19

## 2016-06-24 HISTORY — DX: Anemia, unspecified: D64.9

## 2016-06-24 HISTORY — DX: Cardiac murmur, unspecified: R01.1

## 2016-06-24 HISTORY — DX: Personal history of urinary calculi: Z87.442

## 2016-06-24 HISTORY — PX: ESOPHAGOGASTRODUODENOSCOPY (EGD) WITH PROPOFOL: SHX5813

## 2016-06-24 HISTORY — PX: COLONOSCOPY WITH PROPOFOL: SHX5780

## 2016-06-24 SURGERY — ESOPHAGOGASTRODUODENOSCOPY (EGD) WITH PROPOFOL
Anesthesia: Monitor Anesthesia Care

## 2016-06-24 MED ORDER — SODIUM CHLORIDE 0.9 % IV SOLN: INTRAVENOUS | Status: DC

## 2016-06-24 MED ORDER — LACTATED RINGERS IV SOLN
INTRAVENOUS | Status: DC
  Administered 2016-06-24: 09:00:00 via INTRAVENOUS

## 2016-06-24 MED ORDER — LIDOCAINE 2% (20 MG/ML) 5 ML SYRINGE
INTRAMUSCULAR | Status: DC | PRN
  Administered 2016-06-24: 60 mg via INTRAVENOUS

## 2016-06-24 MED ORDER — PROPOFOL 500 MG/50ML IV EMUL
INTRAVENOUS | Status: DC | PRN
  Administered 2016-06-24: 200 ug/kg/min via INTRAVENOUS

## 2016-06-24 NOTE — Op Note (Signed)
[facility] Patient Name: Kathryn Pittman Procedure Date : 06/24/2016 MRN: [id number] Attending MD: Ronald Lobo , MD Date of Birth: Jan 07, 1956 CSN: [id number] Age: 60 Admit Type: Outpatient Procedure:                Colonoscopy Indications:              High risk colon cancer surveillance: Personal                            history of adenoma (10 mm or greater in size), Last                            colonoscopy: December 2011 Providers:                Ronald Lobo, MD, Cleda Daub, RN, William Dalton, Technician Referring MD:              Medicines:                Monitored Anesthesia Care Complications:            No immediate complications. Estimated Blood Loss:     Estimated blood loss was minimal. Procedure:                Pre-Anesthesia Assessment:                           - Prior to the procedure, a History and Physical                            was performed, and patient medications and                            allergies were reviewed. The patient's tolerance of                            previous anesthesia was also reviewed. The risks                            and benefits of the procedure and the sedation                            options and risks were discussed with the patient.                            All questions were answered, and informed consent                            was obtained. Prior Anticoagulants: The patient has                            taken no previous anticoagulant or antiplatelet  agents. ASA Grade Assessment: III - A patient with                            severe systemic disease. After reviewing the risks                            and benefits, the patient was deemed in                            satisfactory condition to undergo the procedure.                           After obtaining informed consent, the colonoscope                            was passed under  direct vision. Throughout the                            procedure, the patient's blood pressure, pulse, and                            oxygen saturations were monitored continuously. The                            [id number] scope was introduced through                            the anus and advanced to the the cecum, identified                            by the ileocecal valve. The colonoscopy was                            performed without difficulty. The patient tolerated                            the procedure well. The quality of the bowel                            preparation was excellent. The ileocecal valve, the                            appendiceal orifice and the rectum were                            photographed. Scope In: 8:57:16 AM Scope Out: 9:15:46 AM Scope Withdrawal Time: 0 hours 11 minutes 53 seconds  Total Procedure Duration: 0 hours 18 minutes 30 seconds  Findings:      The perianal exam findings include skin tags.      Two sessile polyps were found in the cecum. The polyps were diminutive       in size. These were biopsied with a cold forceps for histology.      A diminutive polyp was found in the transverse colon. The  polyp was       sessile. Biopsies were taken with a cold forceps for histology.       Estimated blood loss was minimal.      No other significant abnormalities were identified in a careful       examination of the remainder of the colon.      The retroflexed view of the distal rectum and anal verge was normal and       showed no anal or rectal abnormalities. Reinspection of the rectum       showed no additional findings. Impression:               - Perianal skin tags found on perianal exam.                           - Two diminutive polyps in the cecum. Biopsied.                           - One diminutive polyp in the transverse colon.                            Biopsied.                           - The distal rectum and anal verge  are normal on                            retroflexion view. Moderate Sedation:      This patient was sedated with monitored anesthesia care, not moderate       sedation. Recommendation:           - Await pathology results.                           - Repeat colonoscopy in 5 years for surveillance.                           - Resume previous diet.                           - Continue present medications. Procedure Code(s):        --- Professional ---                           [phone], Colonoscopy, flexible; with biopsy, single                            or multiple Diagnosis Code(s):        --- Professional ---                           D12.0, Benign neoplasm of cecum                           Z86.010, Personal history of colonic polyps                           D12.3, Benign neoplasm of transverse colon (hepatic  flexure or splenic flexure) CPT copyright 2016 [employer] Association. All rights reserved. The codes documented in this report are preliminary and upon coder review may  be revised to meet current compliance requirements. Ronald Lobo, MD 06/24/2016 9:43:00 AM This report has been signed electronically. Number of Addenda: 0

## 2016-06-24 NOTE — Op Note (Signed)
[facility] Patient Name: Kathryn Pittman Procedure Date : 06/24/2016 MRN: [id number] Attending MD: Ronald Lobo , MD Date of Birth: [birth year] CSN: [id number] Age: 60 Admit Type: Outpatient Procedure:                Upper GI endoscopy Indications:              Esophageal reflux, Follow-up of Barrett's esophagus Providers:                Ronald Lobo, MD, Cleda Daub, RN, William Dalton, Technician Referring MD:              Medicines:                Monitored Anesthesia Care Complications:            No immediate complications. Estimated Blood Loss:     Estimated blood loss was minimal. Procedure:                Pre-Anesthesia Assessment:                           - Prior to the procedure, a History and Physical                            was performed, and patient medications and                            allergies were reviewed. The patient's tolerance of                            previous anesthesia was also reviewed. The risks                            and benefits of the procedure and the sedation                            options and risks were discussed with the patient.                            All questions were answered, and informed consent                            was obtained. Prior Anticoagulants: The patient has                            taken no previous anticoagulant or antiplatelet                            agents. ASA Grade Assessment: III - A patient with                            severe systemic disease. After reviewing the risks  and benefits, the patient was deemed in                            satisfactory condition to undergo the procedure.                           After obtaining informed consent, the endoscope was                            passed under direct vision. Throughout the                            procedure, the patient's blood pressure, pulse, and                oxygen saturations were monitored continuously. The                            [id number]) scope was introduced through the                            mouth, and advanced to the second part of duodenum.                            The upper GI endoscopy was accomplished without                            difficulty. The patient tolerated the procedure                            well. Scope In: Scope Out: Findings:      The examined esophagus was normal.      The Z-line was irregular. Biopsies were taken with a cold forceps for       histology. Estimated blood loss was minimal.      There is no endoscopic evidence of overt Barrett's esophagus or       esophagitis in the entire esophagus.      Bilious fluid was found on the greater curvature of the stomach.      Evidence of a Nissen fundoplication was found in the cardia. The wrap       appeared not intact.      The exam of the stomach was otherwise normal.      The examined duodenum was normal. Impression:               - Normal esophagus.                           - Z-line irregular, but no overt Barrett's.                            Biopsied.                           - Bilious gastric fluid.                           - A Nissen fundoplication  was found. The wrap                            appears not intact.                           - Normal examined duodenum. Moderate Sedation:      This patient was sedated with monitored anesthesia care, not moderate       sedation. Recommendation:           - Continue present medications.                           - Perform a colonoscopy today.                           - Resume previous diet today. Procedure Code(s):        --- Professional ---                           [id number], Esophagogastroduodenoscopy, flexible,                            transoral; with biopsy, single or multiple Diagnosis Code(s):        --- Professional ---                           K22.70, Barrett's  esophagus without dysplasia                           K21.9, Gastro-esophageal reflux disease without                            esophagitis CPT copyright 2016 American Medical Association. All rights reserved. The codes documented in this report are preliminary and upon coder review may  be revised to meet current compliance requirements. Ronald Lobo, MD 06/24/2016 9:30:24 AM This report has been signed electronically. Number of Addenda: 0

## 2016-06-24 NOTE — Transfer of Care (Signed)
Immediate Anesthesia Transfer of Care Note  Patient: Kathryn Pittman  Procedure(s) Performed: Procedure(s): ESOPHAGOGASTRODUODENOSCOPY (EGD) WITH PROPOFOL (N/A) COLONOSCOPY WITH PROPOFOL (N/A)  Patient Location: Endoscopy Unit  Anesthesia Type:MAC  Level of Consciousness: awake, alert , oriented and patient cooperative  Airway & Oxygen Therapy: Patient Spontanous Breathing and Patient connected to nasal cannula oxygen  Post-op Assessment: Report given to RN, Post -op Vital signs reviewed and stable and Patient moving all extremities  Post vital signs: Reviewed and stable  Last Vitals:  Vitals:   06/24/16 0930 06/24/16 0940  BP: 118/65 134/80  Pulse: 68 70  Resp: 13 (!) 30  Temp:      Last Pain:  Vitals:   06/24/16 0922  TempSrc: Oral         Complications: No apparent anesthesia complications

## 2016-06-24 NOTE — Anesthesia Preprocedure Evaluation (Signed)
Anesthesia Evaluation  Patient identified by MRN, date of birth, ID band Patient awake    Reviewed: Allergy & Precautions, NPO status , Patient's Chart, lab work & pertinent test results  Airway Mallampati: II  TM Distance: >3 FB Neck ROM: Full    Dental no notable dental hx.    Pulmonary neg pulmonary ROS, sleep apnea ,    Pulmonary exam normal breath sounds clear to auscultation       Cardiovascular hypertension, negative cardio ROS Normal cardiovascular exam+ Valvular Problems/Murmurs  Rhythm:Regular Rate:Normal     Neuro/Psych negative psych ROS   GI/Hepatic Neg liver ROS, hiatal hernia, GERD  ,  Endo/Other  Morbid obesity  Renal/GU negative Renal ROS     Musculoskeletal  (+) Arthritis ,   Abdominal (+) + obese,   Peds  Hematology negative hematology ROS (+) anemia ,   Anesthesia Other Findings   Reproductive/Obstetrics negative OB ROS                             Anesthesia Physical Anesthesia Plan  ASA: III  Anesthesia Plan: MAC   Post-op Pain Management:    Induction: Intravenous  Airway Management Planned:   Additional Equipment:   Intra-op Plan:   Post-operative Plan:   Informed Consent: I have reviewed the patients History and Physical, chart, labs and discussed the procedure including the risks, benefits and alternatives for the proposed anesthesia with the patient or authorized representative who has indicated his/her understanding and acceptance.   Dental advisory given  Plan Discussed with: CRNA  Anesthesia Plan Comments:         Anesthesia Quick Evaluation

## 2016-06-24 NOTE — Discharge Instructions (Signed)

## 2016-06-24 NOTE — Anesthesia Postprocedure Evaluation (Signed)
Anesthesia Post Note  Patient: Kathryn Pittman  Procedure(s) Performed: Procedure(s) (LRB): ESOPHAGOGASTRODUODENOSCOPY (EGD) WITH PROPOFOL (N/A) COLONOSCOPY WITH PROPOFOL (N/A)  Patient location during evaluation: Endoscopy Anesthesia Type: MAC Level of consciousness: awake and alert Pain management: pain level controlled Vital Signs Assessment: post-procedure vital signs reviewed and stable Respiratory status: spontaneous breathing Cardiovascular status: stable Anesthetic complications: no       Last Vitals:  Vitals:   06/24/16 0930 06/24/16 0940  BP: 118/65 134/80  Pulse: 68 70  Resp: 13 (!) 30  Temp:      Last Pain:  Vitals:   06/24/16 0922  TempSrc: Oral                 Nolon Nations

## 2016-06-24 NOTE — H&P (Signed)
Kathryn Pittman is an 60 y.o. female.   Chief Complaint: Barrett's esoph, h/o colon polyps HPI: Pt presents for endoscopic/colonoscopic monitoring.  She requires ongoing PPI therapy despite a prior Nissen.  No worrisome UGI sx.  Past Medical History:  Diagnosis Date  . Allergic rhinitis   . Anemia    hx of low iron  . Arthritis   . GERD (gastroesophageal reflux disease)    with Barretts esophagus  . Heart murmur    "slight"   . History of hiatal hernia    has been repaired  . History of kidney stones   . Hypertension   . Legally blind   . Macular degeneration, bilateral   . Morton's neuroma    Left  . Onychomycosis   . OSA (obstructive sleep apnea)    on BiPAP 16/12 cm H2O  . Osteoarthritis, shoulder    right  . Psoriasis   . TMJ syndrome    Left>right    Past Surgical History:  Procedure Laterality Date  . ABDOMINAL HYSTERECTOMY    . CHOLECYSTECTOMY    . COLONOSCOPY    . hiatal hernia surgery    . KNEE ARTHROSCOPY      Family History  Problem Relation Age of Onset  . Heart attack Mother    Social History:  reports that she has never smoked. She has never used smokeless tobacco. She reports that she does not drink alcohol or use drugs.  Allergies:  Allergies  Allergen Reactions  . Excedrin Extra Strength [Aspirin-Acetaminophen-Caffeine] Itching    Medications Prior to Admission  Medication Sig Dispense Refill  . adalimumab (HUMIRA PEN) 40 MG/0.8ML injection Inject 40 mg into the skin every 14 (fourteen) days.    Marland Kitchen aspirin 81 MG tablet Take 81 mg by mouth daily.    . cetirizine (ZYRTEC) 10 MG tablet Take 10 mg by mouth daily.    . fluticasone (FLONASE) 50 MCG/ACT nasal spray Place 2 sprays into both nostrils daily as needed for allergies.     Marland Kitchen lisinopril (PRINIVIL,ZESTRIL) 40 MG tablet Take 40 mg by mouth daily.    . Multiple Vitamins-Minerals (VITEYES AREDS FORMULA/LUTEIN PO) Take 1 tablet by mouth daily.     . pantoprazole (PROTONIX) 40 MG tablet Take 40  mg by mouth daily.    . vitamin B-12 (CYANOCOBALAMIN) 100 MCG tablet Take 100 mcg by mouth daily.      No results found for this or any previous visit (from the past 48 hour(s)). No results found.  ROS see above  Blood pressure (!) 150/53, pulse 85, temperature 98 F (36.7 C), temperature source Oral, resp. rate 17, height 5' 2.5" (1.588 m), weight 294 lb (133.4 kg), SpO2 98 %. Physical Exam  Pleasant, alert, morbidly obese.  Chest clr, heart nl, abd NT.  Assessment/Plan Medically appropriate to proceed w/ egd/colon as planned.  Cleotis Nipper, MD 06/24/2016, 8:38 AM

## 2016-07-27 DIAGNOSIS — G4733 Obstructive sleep apnea (adult) (pediatric): Secondary | ICD-10-CM | POA: Diagnosis not present

## 2016-09-01 DIAGNOSIS — M25511 Pain in right shoulder: Secondary | ICD-10-CM | POA: Diagnosis not present

## 2016-09-01 DIAGNOSIS — G4733 Obstructive sleep apnea (adult) (pediatric): Secondary | ICD-10-CM | POA: Diagnosis not present

## 2016-09-03 ENCOUNTER — Other Ambulatory Visit: Payer: Self-pay | Admitting: Physician Assistant

## 2016-09-03 ENCOUNTER — Ambulatory Visit (HOSPITAL_COMMUNITY)
Admission: RE | Admit: 2016-09-03 | Discharge: 2016-09-03 | Disposition: A | Discharge status: Home / Self Care | Payer: 59 | Source: Ambulatory Visit | Attending: Physician Assistant | Admitting: Physician Assistant

## 2016-09-03 DIAGNOSIS — S022XXA Fracture of nasal bones, initial encounter for closed fracture: Secondary | ICD-10-CM

## 2016-09-03 DIAGNOSIS — S93492A Sprain of other ligament of left ankle, initial encounter: Secondary | ICD-10-CM | POA: Diagnosis not present

## 2016-09-03 DIAGNOSIS — S0993XA Unspecified injury of face, initial encounter: Secondary | ICD-10-CM | POA: Diagnosis not present

## 2016-09-03 DIAGNOSIS — S060X0A Concussion without loss of consciousness, initial encounter: Secondary | ICD-10-CM

## 2016-09-03 DIAGNOSIS — S0990XA Unspecified injury of head, initial encounter: Secondary | ICD-10-CM | POA: Diagnosis not present

## 2016-09-03 DIAGNOSIS — S93491A Sprain of other ligament of right ankle, initial encounter: Secondary | ICD-10-CM | POA: Diagnosis not present

## 2016-09-03 DIAGNOSIS — M25531 Pain in right wrist: Secondary | ICD-10-CM | POA: Diagnosis not present

## 2016-09-03 DIAGNOSIS — W1830XA Fall on same level, unspecified, initial encounter: Secondary | ICD-10-CM | POA: Insufficient documentation

## 2016-09-03 DIAGNOSIS — S63501A Unspecified sprain of right wrist, initial encounter: Secondary | ICD-10-CM | POA: Diagnosis not present

## 2016-09-03 DIAGNOSIS — S8001XA Contusion of right knee, initial encounter: Secondary | ICD-10-CM | POA: Diagnosis not present

## 2016-09-03 NOTE — Progress Notes (Unsigned)
Saw this patient in Urgent care.  Fell yesterday face first.  Concussion symptoms today with anterior and posterior head ache, difficulty processing, and "fuzzy headedness."  Bilateral black eyes and nasal tenderness as well.  Doing a head CT to rule out a bleed and facial CT to look for nasal fracture.  Wrist knee and ankle xrays are negative for fracture.  Kathryn Pittman A. Kaleen Mask Physician Assistant [facility] [phone]  09/03/2016, 10:45 AM

## 2016-09-06 DIAGNOSIS — S060X0A Concussion without loss of consciousness, initial encounter: Secondary | ICD-10-CM | POA: Diagnosis not present

## 2016-09-06 DIAGNOSIS — W010XXA Fall on same level from slipping, tripping and stumbling without subsequent striking against object, initial encounter: Secondary | ICD-10-CM | POA: Diagnosis not present

## 2016-09-16 DIAGNOSIS — I10 Essential (primary) hypertension: Secondary | ICD-10-CM | POA: Diagnosis not present

## 2016-09-16 DIAGNOSIS — H548 Legal blindness, as defined in USA: Secondary | ICD-10-CM | POA: Diagnosis not present

## 2016-09-16 DIAGNOSIS — G4733 Obstructive sleep apnea (adult) (pediatric): Secondary | ICD-10-CM | POA: Diagnosis not present

## 2016-09-16 DIAGNOSIS — L409 Psoriasis, unspecified: Secondary | ICD-10-CM | POA: Diagnosis not present

## 2016-09-16 DIAGNOSIS — E119 Type 2 diabetes mellitus without complications: Secondary | ICD-10-CM | POA: Diagnosis not present

## 2016-09-16 DIAGNOSIS — S8001XD Contusion of right knee, subsequent encounter: Secondary | ICD-10-CM | POA: Diagnosis not present

## 2016-09-16 DIAGNOSIS — K219 Gastro-esophageal reflux disease without esophagitis: Secondary | ICD-10-CM | POA: Diagnosis not present

## 2016-09-16 DIAGNOSIS — J309 Allergic rhinitis, unspecified: Secondary | ICD-10-CM | POA: Diagnosis not present

## 2016-09-16 DIAGNOSIS — E559 Vitamin D deficiency, unspecified: Secondary | ICD-10-CM | POA: Diagnosis not present

## 2016-09-16 DIAGNOSIS — K2270 Barrett's esophagus without dysplasia: Secondary | ICD-10-CM | POA: Diagnosis not present

## 2016-09-16 DIAGNOSIS — Z7984 Long term (current) use of oral hypoglycemic drugs: Secondary | ICD-10-CM | POA: Diagnosis not present

## 2016-10-10 DIAGNOSIS — S060X0D Concussion without loss of consciousness, subsequent encounter: Secondary | ICD-10-CM | POA: Diagnosis not present

## 2016-10-28 DIAGNOSIS — Z79899 Other long term (current) drug therapy: Secondary | ICD-10-CM | POA: Diagnosis not present

## 2016-10-28 DIAGNOSIS — L400 Psoriasis vulgaris: Secondary | ICD-10-CM | POA: Diagnosis not present

## 2016-10-28 DIAGNOSIS — Z85828 Personal history of other malignant neoplasm of skin: Secondary | ICD-10-CM | POA: Diagnosis not present

## 2016-10-28 DIAGNOSIS — L918 Other hypertrophic disorders of the skin: Secondary | ICD-10-CM | POA: Diagnosis not present

## 2016-10-28 DIAGNOSIS — L570 Actinic keratosis: Secondary | ICD-10-CM | POA: Diagnosis not present

## 2016-10-28 DIAGNOSIS — D1801 Hemangioma of skin and subcutaneous tissue: Secondary | ICD-10-CM | POA: Diagnosis not present

## 2016-10-28 DIAGNOSIS — L821 Other seborrheic keratosis: Secondary | ICD-10-CM | POA: Diagnosis not present

## 2016-11-08 DIAGNOSIS — H353134 Nonexudative age-related macular degeneration, bilateral, advanced atrophic with subfoveal involvement: Secondary | ICD-10-CM | POA: Diagnosis not present

## 2016-11-08 DIAGNOSIS — H3553 Other dystrophies primarily involving the sensory retina: Secondary | ICD-10-CM | POA: Diagnosis not present

## 2016-11-08 DIAGNOSIS — H2512 Age-related nuclear cataract, left eye: Secondary | ICD-10-CM | POA: Diagnosis not present

## 2016-11-14 ENCOUNTER — Other Ambulatory Visit: Payer: Self-pay | Admitting: Orthopedic Surgery

## 2016-11-14 DIAGNOSIS — M25531 Pain in right wrist: Secondary | ICD-10-CM

## 2016-11-14 DIAGNOSIS — S060X0D Concussion without loss of consciousness, subsequent encounter: Secondary | ICD-10-CM | POA: Diagnosis not present

## 2016-11-24 ENCOUNTER — Other Ambulatory Visit: Payer: 59

## 2017-01-10 DIAGNOSIS — G4733 Obstructive sleep apnea (adult) (pediatric): Secondary | ICD-10-CM | POA: Diagnosis not present

## 2017-02-03 DIAGNOSIS — K2270 Barrett's esophagus without dysplasia: Secondary | ICD-10-CM | POA: Diagnosis not present

## 2017-02-03 DIAGNOSIS — J309 Allergic rhinitis, unspecified: Secondary | ICD-10-CM | POA: Diagnosis not present

## 2017-02-03 DIAGNOSIS — M545 Low back pain: Secondary | ICD-10-CM | POA: Diagnosis not present

## 2017-02-03 DIAGNOSIS — Z23 Encounter for immunization: Secondary | ICD-10-CM | POA: Diagnosis not present

## 2017-02-03 DIAGNOSIS — K219 Gastro-esophageal reflux disease without esophagitis: Secondary | ICD-10-CM | POA: Diagnosis not present

## 2017-02-03 DIAGNOSIS — H548 Legal blindness, as defined in USA: Secondary | ICD-10-CM | POA: Diagnosis not present

## 2017-02-03 DIAGNOSIS — Z0000 Encounter for general adult medical examination without abnormal findings: Secondary | ICD-10-CM | POA: Diagnosis not present

## 2017-02-03 DIAGNOSIS — G4733 Obstructive sleep apnea (adult) (pediatric): Secondary | ICD-10-CM | POA: Diagnosis not present

## 2017-02-03 DIAGNOSIS — E119 Type 2 diabetes mellitus without complications: Secondary | ICD-10-CM | POA: Diagnosis not present

## 2017-02-03 DIAGNOSIS — E559 Vitamin D deficiency, unspecified: Secondary | ICD-10-CM | POA: Diagnosis not present

## 2017-02-03 DIAGNOSIS — I10 Essential (primary) hypertension: Secondary | ICD-10-CM | POA: Diagnosis not present

## 2017-02-03 DIAGNOSIS — L409 Psoriasis, unspecified: Secondary | ICD-10-CM | POA: Diagnosis not present

## 2017-02-24 DIAGNOSIS — Z23 Encounter for immunization: Secondary | ICD-10-CM | POA: Diagnosis not present

## 2017-05-05 ENCOUNTER — Encounter: Payer: Self-pay | Admitting: *Deleted

## 2017-05-19 ENCOUNTER — Ambulatory Visit: Payer: 59 | Admitting: Cardiology

## 2017-05-23 DIAGNOSIS — G4733 Obstructive sleep apnea (adult) (pediatric): Secondary | ICD-10-CM | POA: Diagnosis not present

## 2017-06-05 DIAGNOSIS — Z1231 Encounter for screening mammogram for malignant neoplasm of breast: Secondary | ICD-10-CM | POA: Diagnosis not present

## 2017-07-12 DIAGNOSIS — Z23 Encounter for immunization: Secondary | ICD-10-CM | POA: Diagnosis not present

## 2017-07-19 NOTE — Progress Notes (Signed)
Cardiology Office Note:    Date:  07/20/2017   ID:  Kathryn Pittman, DOB [birth year], MRN [id number]  PCP:  Antony Contras, MD  Cardiologist:  No primary care provider on file.    Referring MD: Antony Contras, MD   Chief Complaint  Patient presents with  . Sleep Apnea  . Hypertension    History of Present Illness:    Kathryn Pittman is a 62 y.o. female with a hx of severe OSA on BiPAP, HTN and obesity.  She is doing well with her PAP device.  She tolerates the full face mask and feels the pressure is adequate.  Since going on PAP she feels rested in the am and has no significant daytime sleepiness.  She has mild mouth dryness in the am but no nasal dryness or nasal congestion.  She does not think that he snores.    Past Medical History:  Diagnosis Date  . Allergic rhinitis   . Anemia    hx of low iron  . Arthritis   . GERD (gastroesophageal reflux disease)    with Barretts esophagus  . Heart murmur    "slight"   . History of hiatal hernia    has been repaired  . History of kidney stones   . Hypertension   . Legally blind   . Macular degeneration, bilateral   . Morton's neuroma    Left  . Onychomycosis   . OSA (obstructive sleep apnea)    on BiPAP 167/13cm H2O  . Osteoarthritis, shoulder    right  . Psoriasis   . TMJ syndrome    Left>right    Past Surgical History:  Procedure Laterality Date  . ABDOMINAL HYSTERECTOMY    . CHOLECYSTECTOMY    . COLONOSCOPY    . COLONOSCOPY WITH PROPOFOL N/A 06/24/2016   Procedure: COLONOSCOPY WITH PROPOFOL;  Surgeon: Ronald Lobo, MD;  Location: [facility];  Service: Endoscopy;  Laterality: N/A;  . ESOPHAGOGASTRODUODENOSCOPY (EGD) WITH PROPOFOL N/A 06/24/2016   Procedure: ESOPHAGOGASTRODUODENOSCOPY (EGD) WITH PROPOFOL;  Surgeon: Ronald Lobo, MD;  Location: [facility] ENDOSCOPY;  Service: Endoscopy;  Laterality: N/A;  . hiatal hernia surgery    . KNEE ARTHROSCOPY      Current Medications: Current Meds  Medication Sig  .  adalimumab (HUMIRA PEN) 40 MG/0.8ML injection Inject 40 mg into the skin every 14 (fourteen) days.  . cetirizine (ZYRTEC) 10 MG tablet Take 10 mg by mouth daily.  . fluticasone (FLONASE) 50 MCG/ACT nasal spray Place 2 sprays into both nostrils daily as needed for allergies.   Marland Kitchen lisinopril (PRINIVIL,ZESTRIL) 40 MG tablet Take 40 mg by mouth daily.  . Multiple Vitamins-Minerals (VITEYES AREDS FORMULA/LUTEIN PO) Take 1 tablet by mouth daily.   . pantoprazole (PROTONIX) 40 MG tablet Take 40 mg by mouth daily.     Allergies:   Excedrin extra strength [aspirin-acetaminophen-caffeine]   Social History   Socioeconomic History  . Marital status: Married    Spouse name: None  . Number of children: None  . Years of education: None  . Highest education level: None  Social Needs  . Financial resource strain: None  . Food insecurity - worry: None  . Food insecurity - inability: None  . Transportation needs - medical: None  . Transportation needs - non-medical: None  Occupational History  . None  Tobacco Use  . Smoking status: Never Smoker  . Smokeless tobacco: Never Used  Substance and Sexual Activity  . Alcohol use: No    Alcohol/week: 0.0 oz  .  Drug use: No  . Sexual activity: None  Other Topics Concern  . None  Social History Narrative  . None     Family History: The patient's family history includes Heart attack in her mother.  ROS:   Please see the history of present illness.    ROS  All other systems reviewed and negative.   EKGs/Labs/Other Studies Reviewed:    The following studies were reviewed today: PAP download  EKG:  EKG is not ordered today.   Recent Labs: No results found for requested labs within last 8760 hours.   Recent Lipid Panel No results found for: CHOL, TRIG, HDL, CHOLHDL, VLDL, LDLCALC, LDLDIRECT  Physical Exam:    VS:  BP 140/81   Pulse (!) 102   Ht 5' 2.5" (1.588 m)   Wt 294 lb 6.4 oz (133.5 kg)   BMI 52.99 kg/m     Wt Readings from  Last 3 Encounters:  07/20/17 294 lb 6.4 oz (133.5 kg)  06/24/16 294 lb (133.4 kg)  03/11/16 294 lb 1.9 oz (133.4 kg)     GEN:  Well nourished, well developed in no acute distress HEENT: Normal NECK: No JVD; No carotid bruits LYMPHATICS: No lymphadenopathy CARDIAC: RRR, no murmurs, rubs, gallops RESPIRATORY:  Clear to auscultation without rales, wheezing or rhonchi  ABDOMEN: Soft, non-tender, non-distended MUSCULOSKELETAL:  No edema; No deformity  SKIN: Warm and dry NEUROLOGIC:  Alert and oriented x 3 PSYCHIATRIC:  Normal affect   ASSESSMENT:    1. OSA (obstructive sleep apnea)   2. Essential hypertension, benign   3. Morbid obesity ([place])    PLAN:    In order of problems listed above:  1.  OSA - the patient is tolerating PAP therapy well without any problems. The PAP download was reviewed today and showed an AHI of 1.9/hr on 17/13 cm H2O with 100% compliance in using more than 4 hours nightly.  The patient has been using and benefiting from PAP use and will continue to benefit from therapy.   2.  HTN - BP is well controlled on exam today.  She will continue on Lisinopril 40mg  daily.  3.  Morbid obesity - I have encouraged her to increase exercise 5 times weekly and cut back on carbs and portions.    Medication Adjustments/Labs and Tests Ordered: Current medicines are reviewed at length with the patient today.  Concerns regarding medicines are outlined above.  No orders of the defined types were placed in this encounter.  No orders of the defined types were placed in this encounter.   Signed, Fransico Him, MD  07/20/2017 2:58 PM    [place]

## 2017-07-20 ENCOUNTER — Ambulatory Visit (INDEPENDENT_AMBULATORY_CARE_PROVIDER_SITE_OTHER): Payer: 59 | Admitting: Cardiology

## 2017-07-20 ENCOUNTER — Encounter: Payer: Self-pay | Admitting: Cardiology

## 2017-07-20 VITALS — BP 140/81 | HR 102 | Ht 62.5 in | Wt 294.4 lb

## 2017-07-20 DIAGNOSIS — I10 Essential (primary) hypertension: Secondary | ICD-10-CM

## 2017-07-20 DIAGNOSIS — G4733 Obstructive sleep apnea (adult) (pediatric): Secondary | ICD-10-CM | POA: Diagnosis not present

## 2017-07-20 NOTE — Patient Instructions (Signed)

## 2017-08-11 DIAGNOSIS — K219 Gastro-esophageal reflux disease without esophagitis: Secondary | ICD-10-CM | POA: Diagnosis not present

## 2017-08-11 DIAGNOSIS — E559 Vitamin D deficiency, unspecified: Secondary | ICD-10-CM | POA: Diagnosis not present

## 2017-08-11 DIAGNOSIS — I10 Essential (primary) hypertension: Secondary | ICD-10-CM | POA: Diagnosis not present

## 2017-08-11 DIAGNOSIS — J309 Allergic rhinitis, unspecified: Secondary | ICD-10-CM | POA: Diagnosis not present

## 2017-08-11 DIAGNOSIS — E119 Type 2 diabetes mellitus without complications: Secondary | ICD-10-CM | POA: Diagnosis not present

## 2017-08-11 DIAGNOSIS — L409 Psoriasis, unspecified: Secondary | ICD-10-CM | POA: Diagnosis not present

## 2017-08-11 DIAGNOSIS — Z7984 Long term (current) use of oral hypoglycemic drugs: Secondary | ICD-10-CM | POA: Diagnosis not present

## 2017-08-11 DIAGNOSIS — K2270 Barrett's esophagus without dysplasia: Secondary | ICD-10-CM | POA: Diagnosis not present

## 2017-08-11 DIAGNOSIS — G4733 Obstructive sleep apnea (adult) (pediatric): Secondary | ICD-10-CM | POA: Diagnosis not present

## 2017-08-11 DIAGNOSIS — Z6841 Body Mass Index (BMI) 40.0 and over, adult: Secondary | ICD-10-CM | POA: Diagnosis not present

## 2017-08-11 DIAGNOSIS — H548 Legal blindness, as defined in USA: Secondary | ICD-10-CM | POA: Diagnosis not present

## 2017-08-31 DIAGNOSIS — M9902 Segmental and somatic dysfunction of thoracic region: Secondary | ICD-10-CM | POA: Diagnosis not present

## 2017-08-31 DIAGNOSIS — M9901 Segmental and somatic dysfunction of cervical region: Secondary | ICD-10-CM | POA: Diagnosis not present

## 2017-08-31 DIAGNOSIS — M542 Cervicalgia: Secondary | ICD-10-CM | POA: Diagnosis not present

## 2017-09-07 DIAGNOSIS — M9902 Segmental and somatic dysfunction of thoracic region: Secondary | ICD-10-CM | POA: Diagnosis not present

## 2017-09-07 DIAGNOSIS — M542 Cervicalgia: Secondary | ICD-10-CM | POA: Diagnosis not present

## 2017-09-07 DIAGNOSIS — M9901 Segmental and somatic dysfunction of cervical region: Secondary | ICD-10-CM | POA: Diagnosis not present

## 2017-09-14 DIAGNOSIS — M9902 Segmental and somatic dysfunction of thoracic region: Secondary | ICD-10-CM | POA: Diagnosis not present

## 2017-09-14 DIAGNOSIS — M9901 Segmental and somatic dysfunction of cervical region: Secondary | ICD-10-CM | POA: Diagnosis not present

## 2017-09-14 DIAGNOSIS — M542 Cervicalgia: Secondary | ICD-10-CM | POA: Diagnosis not present

## 2017-09-14 DIAGNOSIS — M5382 Other specified dorsopathies, cervical region: Secondary | ICD-10-CM | POA: Diagnosis not present

## 2017-09-14 DIAGNOSIS — M546 Pain in thoracic spine: Secondary | ICD-10-CM | POA: Diagnosis not present

## 2017-09-18 DIAGNOSIS — M542 Cervicalgia: Secondary | ICD-10-CM | POA: Diagnosis not present

## 2017-09-18 DIAGNOSIS — M9902 Segmental and somatic dysfunction of thoracic region: Secondary | ICD-10-CM | POA: Diagnosis not present

## 2017-09-18 DIAGNOSIS — M5382 Other specified dorsopathies, cervical region: Secondary | ICD-10-CM | POA: Diagnosis not present

## 2017-09-18 DIAGNOSIS — M5033 Other cervical disc degeneration, cervicothoracic region: Secondary | ICD-10-CM | POA: Diagnosis not present

## 2017-09-18 DIAGNOSIS — M9901 Segmental and somatic dysfunction of cervical region: Secondary | ICD-10-CM | POA: Diagnosis not present

## 2017-09-18 DIAGNOSIS — M546 Pain in thoracic spine: Secondary | ICD-10-CM | POA: Diagnosis not present

## 2017-09-22 DIAGNOSIS — M9902 Segmental and somatic dysfunction of thoracic region: Secondary | ICD-10-CM | POA: Diagnosis not present

## 2017-09-22 DIAGNOSIS — M542 Cervicalgia: Secondary | ICD-10-CM | POA: Diagnosis not present

## 2017-09-22 DIAGNOSIS — M9901 Segmental and somatic dysfunction of cervical region: Secondary | ICD-10-CM | POA: Diagnosis not present

## 2017-09-26 DIAGNOSIS — M542 Cervicalgia: Secondary | ICD-10-CM | POA: Diagnosis not present

## 2017-09-26 DIAGNOSIS — M546 Pain in thoracic spine: Secondary | ICD-10-CM | POA: Diagnosis not present

## 2017-09-26 DIAGNOSIS — M5033 Other cervical disc degeneration, cervicothoracic region: Secondary | ICD-10-CM | POA: Diagnosis not present

## 2017-09-26 DIAGNOSIS — M9901 Segmental and somatic dysfunction of cervical region: Secondary | ICD-10-CM | POA: Diagnosis not present

## 2017-09-26 DIAGNOSIS — M5382 Other specified dorsopathies, cervical region: Secondary | ICD-10-CM | POA: Diagnosis not present

## 2017-09-26 DIAGNOSIS — M9902 Segmental and somatic dysfunction of thoracic region: Secondary | ICD-10-CM | POA: Diagnosis not present

## 2017-09-28 DIAGNOSIS — M5382 Other specified dorsopathies, cervical region: Secondary | ICD-10-CM | POA: Diagnosis not present

## 2017-09-28 DIAGNOSIS — M546 Pain in thoracic spine: Secondary | ICD-10-CM | POA: Diagnosis not present

## 2017-09-28 DIAGNOSIS — M9902 Segmental and somatic dysfunction of thoracic region: Secondary | ICD-10-CM | POA: Diagnosis not present

## 2017-09-28 DIAGNOSIS — M9901 Segmental and somatic dysfunction of cervical region: Secondary | ICD-10-CM | POA: Diagnosis not present

## 2017-09-28 DIAGNOSIS — M542 Cervicalgia: Secondary | ICD-10-CM | POA: Diagnosis not present

## 2017-09-28 DIAGNOSIS — M5033 Other cervical disc degeneration, cervicothoracic region: Secondary | ICD-10-CM | POA: Diagnosis not present

## 2017-10-03 DIAGNOSIS — M9901 Segmental and somatic dysfunction of cervical region: Secondary | ICD-10-CM | POA: Diagnosis not present

## 2017-10-03 DIAGNOSIS — M9902 Segmental and somatic dysfunction of thoracic region: Secondary | ICD-10-CM | POA: Diagnosis not present

## 2017-10-03 DIAGNOSIS — M542 Cervicalgia: Secondary | ICD-10-CM | POA: Diagnosis not present

## 2017-10-05 DIAGNOSIS — M542 Cervicalgia: Secondary | ICD-10-CM | POA: Diagnosis not present

## 2017-10-05 DIAGNOSIS — M9901 Segmental and somatic dysfunction of cervical region: Secondary | ICD-10-CM | POA: Diagnosis not present

## 2017-10-05 DIAGNOSIS — M9902 Segmental and somatic dysfunction of thoracic region: Secondary | ICD-10-CM | POA: Diagnosis not present

## 2017-10-06 DIAGNOSIS — G4733 Obstructive sleep apnea (adult) (pediatric): Secondary | ICD-10-CM | POA: Diagnosis not present

## 2017-10-10 DIAGNOSIS — M9901 Segmental and somatic dysfunction of cervical region: Secondary | ICD-10-CM | POA: Diagnosis not present

## 2017-10-10 DIAGNOSIS — M5382 Other specified dorsopathies, cervical region: Secondary | ICD-10-CM | POA: Diagnosis not present

## 2017-10-10 DIAGNOSIS — M546 Pain in thoracic spine: Secondary | ICD-10-CM | POA: Diagnosis not present

## 2017-10-10 DIAGNOSIS — M542 Cervicalgia: Secondary | ICD-10-CM | POA: Diagnosis not present

## 2017-10-10 DIAGNOSIS — M5033 Other cervical disc degeneration, cervicothoracic region: Secondary | ICD-10-CM | POA: Diagnosis not present

## 2017-10-10 DIAGNOSIS — M9902 Segmental and somatic dysfunction of thoracic region: Secondary | ICD-10-CM | POA: Diagnosis not present

## 2017-10-12 DIAGNOSIS — M546 Pain in thoracic spine: Secondary | ICD-10-CM | POA: Diagnosis not present

## 2017-10-12 DIAGNOSIS — M9901 Segmental and somatic dysfunction of cervical region: Secondary | ICD-10-CM | POA: Diagnosis not present

## 2017-10-12 DIAGNOSIS — M5033 Other cervical disc degeneration, cervicothoracic region: Secondary | ICD-10-CM | POA: Diagnosis not present

## 2017-10-12 DIAGNOSIS — M5382 Other specified dorsopathies, cervical region: Secondary | ICD-10-CM | POA: Diagnosis not present

## 2017-10-12 DIAGNOSIS — M542 Cervicalgia: Secondary | ICD-10-CM | POA: Diagnosis not present

## 2017-10-12 DIAGNOSIS — M9902 Segmental and somatic dysfunction of thoracic region: Secondary | ICD-10-CM | POA: Diagnosis not present

## 2017-10-17 DIAGNOSIS — M5382 Other specified dorsopathies, cervical region: Secondary | ICD-10-CM | POA: Diagnosis not present

## 2017-10-17 DIAGNOSIS — M546 Pain in thoracic spine: Secondary | ICD-10-CM | POA: Diagnosis not present

## 2017-10-17 DIAGNOSIS — M5033 Other cervical disc degeneration, cervicothoracic region: Secondary | ICD-10-CM | POA: Diagnosis not present

## 2017-10-17 DIAGNOSIS — M9901 Segmental and somatic dysfunction of cervical region: Secondary | ICD-10-CM | POA: Diagnosis not present

## 2017-10-17 DIAGNOSIS — M542 Cervicalgia: Secondary | ICD-10-CM | POA: Diagnosis not present

## 2017-10-17 DIAGNOSIS — M9902 Segmental and somatic dysfunction of thoracic region: Secondary | ICD-10-CM | POA: Diagnosis not present

## 2017-10-17 DIAGNOSIS — Z111 Encounter for screening for respiratory tuberculosis: Secondary | ICD-10-CM | POA: Diagnosis not present

## 2017-10-26 DIAGNOSIS — M546 Pain in thoracic spine: Secondary | ICD-10-CM | POA: Diagnosis not present

## 2017-10-26 DIAGNOSIS — M9902 Segmental and somatic dysfunction of thoracic region: Secondary | ICD-10-CM | POA: Diagnosis not present

## 2017-10-26 DIAGNOSIS — M5382 Other specified dorsopathies, cervical region: Secondary | ICD-10-CM | POA: Diagnosis not present

## 2017-10-26 DIAGNOSIS — M5033 Other cervical disc degeneration, cervicothoracic region: Secondary | ICD-10-CM | POA: Diagnosis not present

## 2017-10-26 DIAGNOSIS — M9901 Segmental and somatic dysfunction of cervical region: Secondary | ICD-10-CM | POA: Diagnosis not present

## 2017-10-26 DIAGNOSIS — M542 Cervicalgia: Secondary | ICD-10-CM | POA: Diagnosis not present

## 2017-10-31 DIAGNOSIS — D225 Melanocytic nevi of trunk: Secondary | ICD-10-CM | POA: Diagnosis not present

## 2017-10-31 DIAGNOSIS — Z85828 Personal history of other malignant neoplasm of skin: Secondary | ICD-10-CM | POA: Diagnosis not present

## 2017-10-31 DIAGNOSIS — L918 Other hypertrophic disorders of the skin: Secondary | ICD-10-CM | POA: Diagnosis not present

## 2017-11-02 DIAGNOSIS — M542 Cervicalgia: Secondary | ICD-10-CM | POA: Diagnosis not present

## 2017-11-02 DIAGNOSIS — M5033 Other cervical disc degeneration, cervicothoracic region: Secondary | ICD-10-CM | POA: Diagnosis not present

## 2017-11-02 DIAGNOSIS — M9901 Segmental and somatic dysfunction of cervical region: Secondary | ICD-10-CM | POA: Diagnosis not present

## 2017-11-02 DIAGNOSIS — M5382 Other specified dorsopathies, cervical region: Secondary | ICD-10-CM | POA: Diagnosis not present

## 2017-11-02 DIAGNOSIS — M9902 Segmental and somatic dysfunction of thoracic region: Secondary | ICD-10-CM | POA: Diagnosis not present

## 2017-11-02 DIAGNOSIS — M546 Pain in thoracic spine: Secondary | ICD-10-CM | POA: Diagnosis not present

## 2017-11-09 DIAGNOSIS — H2512 Age-related nuclear cataract, left eye: Secondary | ICD-10-CM | POA: Diagnosis not present

## 2017-11-09 DIAGNOSIS — H353134 Nonexudative age-related macular degeneration, bilateral, advanced atrophic with subfoveal involvement: Secondary | ICD-10-CM | POA: Diagnosis not present

## 2017-11-09 DIAGNOSIS — H3553 Other dystrophies primarily involving the sensory retina: Secondary | ICD-10-CM | POA: Diagnosis not present

## 2018-01-05 DIAGNOSIS — G4733 Obstructive sleep apnea (adult) (pediatric): Secondary | ICD-10-CM | POA: Diagnosis not present

## 2018-02-03 DIAGNOSIS — J069 Acute upper respiratory infection, unspecified: Secondary | ICD-10-CM | POA: Diagnosis not present

## 2018-02-03 DIAGNOSIS — J029 Acute pharyngitis, unspecified: Secondary | ICD-10-CM | POA: Diagnosis not present

## 2018-02-03 DIAGNOSIS — J020 Streptococcal pharyngitis: Secondary | ICD-10-CM | POA: Diagnosis not present

## 2018-02-23 DIAGNOSIS — Z0000 Encounter for general adult medical examination without abnormal findings: Secondary | ICD-10-CM | POA: Diagnosis not present

## 2018-02-23 DIAGNOSIS — K2270 Barrett's esophagus without dysplasia: Secondary | ICD-10-CM | POA: Diagnosis not present

## 2018-02-23 DIAGNOSIS — E559 Vitamin D deficiency, unspecified: Secondary | ICD-10-CM | POA: Diagnosis not present

## 2018-02-23 DIAGNOSIS — R011 Cardiac murmur, unspecified: Secondary | ICD-10-CM | POA: Diagnosis not present

## 2018-02-23 DIAGNOSIS — J309 Allergic rhinitis, unspecified: Secondary | ICD-10-CM | POA: Diagnosis not present

## 2018-02-23 DIAGNOSIS — E1169 Type 2 diabetes mellitus with other specified complication: Secondary | ICD-10-CM | POA: Diagnosis not present

## 2018-02-23 DIAGNOSIS — L409 Psoriasis, unspecified: Secondary | ICD-10-CM | POA: Diagnosis not present

## 2018-02-23 DIAGNOSIS — I10 Essential (primary) hypertension: Secondary | ICD-10-CM | POA: Diagnosis not present

## 2018-02-23 DIAGNOSIS — H548 Legal blindness, as defined in USA: Secondary | ICD-10-CM | POA: Diagnosis not present

## 2018-02-23 DIAGNOSIS — K219 Gastro-esophageal reflux disease without esophagitis: Secondary | ICD-10-CM | POA: Diagnosis not present

## 2018-02-23 DIAGNOSIS — G4733 Obstructive sleep apnea (adult) (pediatric): Secondary | ICD-10-CM | POA: Diagnosis not present

## 2018-04-12 DIAGNOSIS — Z85828 Personal history of other malignant neoplasm of skin: Secondary | ICD-10-CM | POA: Diagnosis not present

## 2018-04-12 DIAGNOSIS — L400 Psoriasis vulgaris: Secondary | ICD-10-CM | POA: Diagnosis not present

## 2018-04-12 DIAGNOSIS — Z79899 Other long term (current) drug therapy: Secondary | ICD-10-CM | POA: Diagnosis not present

## 2018-04-18 DIAGNOSIS — G4733 Obstructive sleep apnea (adult) (pediatric): Secondary | ICD-10-CM | POA: Diagnosis not present

## 2018-07-06 DIAGNOSIS — Z1231 Encounter for screening mammogram for malignant neoplasm of breast: Secondary | ICD-10-CM | POA: Diagnosis not present

## 2018-08-14 DIAGNOSIS — M62830 Muscle spasm of back: Secondary | ICD-10-CM | POA: Diagnosis not present

## 2018-08-17 ENCOUNTER — Ambulatory Visit
Admission: RE | Admit: 2018-08-17 | Discharge: 2018-08-17 | Disposition: A | Discharge status: Home / Self Care | Payer: 59 | Source: Ambulatory Visit | Attending: Family Medicine | Admitting: Family Medicine

## 2018-08-17 ENCOUNTER — Other Ambulatory Visit: Payer: Self-pay | Admitting: Family Medicine

## 2018-08-17 DIAGNOSIS — M47814 Spondylosis without myelopathy or radiculopathy, thoracic region: Secondary | ICD-10-CM | POA: Diagnosis not present

## 2018-08-17 DIAGNOSIS — R0789 Other chest pain: Secondary | ICD-10-CM | POA: Diagnosis not present

## 2018-08-17 DIAGNOSIS — M546 Pain in thoracic spine: Secondary | ICD-10-CM

## 2018-08-24 DIAGNOSIS — G4733 Obstructive sleep apnea (adult) (pediatric): Secondary | ICD-10-CM | POA: Diagnosis not present

## 2018-08-27 DIAGNOSIS — M25511 Pain in right shoulder: Secondary | ICD-10-CM | POA: Diagnosis not present

## 2018-08-31 DIAGNOSIS — J309 Allergic rhinitis, unspecified: Secondary | ICD-10-CM | POA: Diagnosis not present

## 2018-08-31 DIAGNOSIS — E1169 Type 2 diabetes mellitus with other specified complication: Secondary | ICD-10-CM | POA: Diagnosis not present

## 2018-08-31 DIAGNOSIS — H548 Legal blindness, as defined in USA: Secondary | ICD-10-CM | POA: Diagnosis not present

## 2018-08-31 DIAGNOSIS — I10 Essential (primary) hypertension: Secondary | ICD-10-CM | POA: Diagnosis not present

## 2018-08-31 DIAGNOSIS — E559 Vitamin D deficiency, unspecified: Secondary | ICD-10-CM | POA: Diagnosis not present

## 2018-08-31 DIAGNOSIS — L409 Psoriasis, unspecified: Secondary | ICD-10-CM | POA: Diagnosis not present

## 2018-08-31 DIAGNOSIS — R011 Cardiac murmur, unspecified: Secondary | ICD-10-CM | POA: Diagnosis not present

## 2018-08-31 DIAGNOSIS — Z7984 Long term (current) use of oral hypoglycemic drugs: Secondary | ICD-10-CM | POA: Diagnosis not present

## 2018-08-31 DIAGNOSIS — K219 Gastro-esophageal reflux disease without esophagitis: Secondary | ICD-10-CM | POA: Diagnosis not present

## 2018-08-31 DIAGNOSIS — G4733 Obstructive sleep apnea (adult) (pediatric): Secondary | ICD-10-CM | POA: Diagnosis not present

## 2018-08-31 DIAGNOSIS — K2270 Barrett's esophagus without dysplasia: Secondary | ICD-10-CM | POA: Diagnosis not present

## 2018-10-03 ENCOUNTER — Telehealth: Payer: Self-pay

## 2018-10-03 ENCOUNTER — Telehealth: Payer: Self-pay | Admitting: Cardiology

## 2018-10-03 NOTE — Telephone Encounter (Signed)
New message   Patient husband was walked through webex trial. Pt husband has webex downloaded to his smartphone. The only way we were able to get them going on webex was to email the meeting url to his email and he join from there. Attempted to send meeting invite, had pt husband enter meeting number and it would not work

## 2018-10-03 NOTE — Telephone Encounter (Signed)
TELEPHONE CALL NOTE  This patient has been deemed a candidate for follow-up tele-health visit to limit community exposure during the Covid-19 pandemic. I spoke with the patient via phone to discuss instructions. This has been outlined on the patient's AVS (dotphrase: hcevisitinfo). The patient was advised to review the section on consent for treatment as well. The patient will receive a phone call 2-3 days prior to their E-Visit at which time consent will be verbally confirmed. A Virtual Office Visit appointment type has been scheduled for 9:30am on 10/05/2018 with Dr. Radford Pax, with "VIDEO" or "TELEPHONE" in the appointment notes.  Kathryn Pittman, [place] 10/03/2018 2:26 PM      Virtual Visit Pre-Appointment Phone Call  TELEPHONE CALL NOTE  Kathryn Pittman has been deemed a candidate for a follow-up tele-health visit to limit community exposure during the Covid-19 pandemic. I spoke with the patient via phone to ensure availability of phone/video source, confirm preferred email & phone number, and discuss instructions and expectations.  I reminded Kathryn Pittman to be prepared with any vital sign and/or heart rhythm information that could potentially be obtained via home monitoring, at the time of her visit. I reminded Kathryn Pittman to expect a phone call at the time of her visit if her visit.  Did the patient verbally acknowledge consent to treatment? Yes  Kathryn Pittman, [place] 10/03/2018 2:26 PM   DOWNLOADING THE [place], go to [employer] and type in WebEx in the search bar. [place] [employer], the blue/green circle. The app is free but as with any other app downloads, their phone may require them to verify saved payment information or Apple password. The patient does NOT have to create an account.  - If Android, ask patient to go to [employer] and type in WebEx in the search bar. [place] [employer], the blue/green circle. The app is free but  as with any other app downloads, their phone may require them to verify saved payment information or Android password. The patient does NOT have to create an account.   CONSENT FOR TELE-HEALTH VISIT - PLEASE REVIEW  I hereby voluntarily request, consent and authorize [facility] and its employed or contracted physicians, physician assistants, nurse practitioners or other licensed health care professionals (the Practitioner), to provide me with telemedicine health care services (the "Services") as deemed necessary by the treating Practitioner. I acknowledge and consent to receive the Services by the Practitioner via telemedicine. I understand that the telemedicine visit will involve communicating with the Practitioner through live audiovisual communication technology and the disclosure of certain medical information by electronic transmission. I acknowledge that I have been given the opportunity to request an in-person assessment or other available alternative prior to the telemedicine visit and am voluntarily participating in the telemedicine visit.  I understand that I have the right to withhold or withdraw my consent to the use of telemedicine in the course of my care at any time, without affecting my right to future care or treatment, and that the Practitioner or I may terminate the telemedicine visit at any time. I understand that I have the right to inspect all information obtained and/or recorded in the course of the telemedicine visit and may receive copies of available information for a reasonable fee.  I understand that some of the potential risks of receiving the Services via telemedicine include:  Marland Kitchen Delay or interruption in medical evaluation due to technological equipment failure or  disruption; . Information transmitted may not be sufficient (e.g. poor resolution of images) to allow for appropriate medical decision making by the Practitioner; and/or  . In rare instances, security protocols  could fail, causing a breach of personal health information.  Furthermore, I acknowledge that it is my responsibility to provide information about my medical history, conditions and care that is complete and accurate to the best of my ability. I acknowledge that Practitioner's advice, recommendations, and/or decision may be based on factors not within their control, such as incomplete or inaccurate data provided by me or distortions of diagnostic images or specimens that may result from electronic transmissions. I understand that the practice of medicine is not an exact science and that Practitioner makes no warranties or guarantees regarding treatment outcomes. I acknowledge that I will receive a copy of this consent concurrently upon execution via email to the email address I last provided but may also request a printed copy by calling the office of [facility].    I understand that my insurance will be billed for this visit.   I have read or had this consent read to me. . I understand the contents of this consent, which adequately explains the benefits and risks of the Services being provided via telemedicine.  . I have been provided ample opportunity to ask questions regarding this consent and the Services and have had my questions answered to my satisfaction. . I give my informed consent for the services to be provided through the use of telemedicine in my medical care  By participating in this telemedicine visit I agree to the above.

## 2018-10-04 NOTE — Progress Notes (Signed)
Virtual Visit via Video Note    Evaluation Performed:  Follow-up visit  This visit type was conducted due to national recommendations for restrictions regarding the COVID-19 Pandemic (e.g. social distancing).  This format is felt to be most appropriate for this patient at this time.  All issues noted in this document were discussed and addressed.  No physical exam was performed (except for noted visual exam findings with Video Visits).  Please refer to the patient's chart (MyChart message for video visits and phone note for telephone visits) for the patient's consent to telehealth for [facility].  Date:  10/06/2018   ID:  Kathryn Pittman, DOB Sep 14, 1955, MRN [id number]  Patient Location:  Home  Provider location:   [place]  PCP:  Antony Contras, MD  Cardiologist:  No primary care provider on file.  Electrophysiologist:  None  Sleep Medicine:  Fransico Him, MD  Chief Complaint:  OSA, HTN followup  History of Present Illness:    Kathryn Pittman is a 63 y.o. female who presents via audio/video conferencing for a telehealth visit today.    Kathryn Pittman is a 63 y.o. female with a hx of severe OSA on BiPAP, HTN and obesity.   She is doing well with her PAP device and thinks that she has gotten used to it.  She tolerates the mask and feels the pressure is adequate.  Since going on PAP she feels rested in the am and has no significant daytime sleepiness.  She denies any significant mouth or nasal dryness or nasal congestion.  She does not think that he snores.    The patient does not have symptoms concerning for COVID-19 infection (fever, chills, cough, or new shortness of breath).    Prior CV studies:   The following studies were reviewed today:  PAP download  Past Medical History:  Diagnosis Date  . Allergic rhinitis   . Anemia    hx of low iron  . Arthritis   . GERD (gastroesophageal reflux disease)    with Barretts esophagus  . Heart murmur    "slight"   . History of  hiatal hernia    has been repaired  . History of kidney stones   . Hypertension   . Legally blind   . Macular degeneration, bilateral   . Morton's neuroma    Left  . Onychomycosis   . OSA (obstructive sleep apnea)    on BiPAP 167/13cm H2O  . Osteoarthritis, shoulder    right  . Psoriasis   . TMJ syndrome    Left>right   Past Surgical History:  Procedure Laterality Date  . ABDOMINAL HYSTERECTOMY    . CHOLECYSTECTOMY    . COLONOSCOPY    . COLONOSCOPY WITH PROPOFOL N/A 06/24/2016   Procedure: COLONOSCOPY WITH PROPOFOL;  Surgeon: Ronald Lobo, MD;  Location: [facility];  Service: Endoscopy;  Laterality: N/A;  . ESOPHAGOGASTRODUODENOSCOPY (EGD) WITH PROPOFOL N/A 06/24/2016   Procedure: ESOPHAGOGASTRODUODENOSCOPY (EGD) WITH PROPOFOL;  Surgeon: Ronald Lobo, MD;  Location: [facility] ENDOSCOPY;  Service: Endoscopy;  Laterality: N/A;  . hiatal hernia surgery    . KNEE ARTHROSCOPY       Current Meds  Medication Sig  . cetirizine (ZYRTEC) 10 MG tablet Take 10 mg by mouth daily.  . fluticasone (FLONASE) 50 MCG/ACT nasal spray Place 2 sprays into both nostrils daily as needed for allergies.   Marland Kitchen lisinopril (PRINIVIL,ZESTRIL) 40 MG tablet Take 40 mg by mouth daily.  . Multiple Vitamins-Minerals (VITEYES AREDS FORMULA/LUTEIN PO) Take  1 tablet by mouth daily.   . pantoprazole (PROTONIX) 40 MG tablet Take 40 mg by mouth daily.  . SKYRIZI, 150 MG DOSE, 75 MG/0.83ML PSKT as directed.      Allergies:   Excedrin extra strength [aspirin-acetaminophen-caffeine]   Social History   Tobacco Use  . Smoking status: Never Smoker  . Smokeless tobacco: Never Used  Substance Use Topics  . Alcohol use: No    Alcohol/week: 0.0 standard drinks  . Drug use: No     Family Hx: The patient's family history includes Heart attack in her mother.  ROS:   Please see the history of present illness.     All other systems reviewed and are negative.   Labs/Other Tests and Data Reviewed:    Recent Labs:  No results found for requested labs within last 8760 hours.   Recent Lipid Panel No results found for: CHOL, TRIG, HDL, CHOLHDL, LDLCALC, LDLDIRECT  Wt Readings from Last 3 Encounters:  10/05/18 284 lb (128.8 kg)  07/20/17 294 lb 6.4 oz (133.5 kg)  06/24/16 294 lb (133.4 kg)     Objective:    Vital Signs:  BP 134/76   Pulse 87   Ht 5' 2.5" (1.588 m)   Wt 284 lb (128.8 kg)   BMI 51.12 kg/m    Well nourished, well developed female in no acute distress. Well appearing, alert and conversant, regular work of breathing,  good skin color  Eyes- anicteric mouth- oral mucosa is pink  neuro- grossly intact skin- no apparent rash or lesions or cyanosis   ASSESSMENT & PLAN:    1.  OSA - the patient is tolerating PAP therapy well without any problems. The PAP download was reviewed today and showed an AHI of 2/hr on 17/13 cm H2O with 100% compliance in using more than 4 hours nightly.  The patient has been using and benefiting from PAP use and will continue to benefit from therapy.   2.  HTN - she checks her BP at home which has been controlled.  She will continue on Lisinopril 40mg  daily. Her creatinine was stable at 0.78 on 08/2018.  3.  Obesity - I have encouraged her to get into a routine exercise program and cut back on carbs and portions.   COVID-19 Education: The signs and symptoms of COVID-19 were discussed with the patient and how to seek care for testing (follow up with PCP or arrange E-visit).  The importance of social distancing was discussed today.  Patient Risk:   After full review of this patient's clinical status, I feel that they are at least moderate risk at this time.  Time:   Today, I have spent 25 minutes reviewing the chart, BiPAP download and discussing sleep issues with the patient and developing a followup plan as well as discussing ways to be safe in the setting of COVID19 crisis with telehealth technology.     Medication Adjustments/Labs and Tests Ordered:  Current medicines are reviewed at length with the patient today.  Concerns regarding medicines are outlined above.  Tests Ordered: No orders of the defined types were placed in this encounter.  Medication Changes: No orders of the defined types were placed in this encounter.   Disposition:  Follow up in 1 year(s)  Signed, Fransico Him, MD  10/06/2018 7:26 PM    [place]

## 2018-10-05 ENCOUNTER — Other Ambulatory Visit: Payer: Self-pay

## 2018-10-05 ENCOUNTER — Telehealth (INDEPENDENT_AMBULATORY_CARE_PROVIDER_SITE_OTHER): Payer: 59 | Admitting: Cardiology

## 2018-10-05 ENCOUNTER — Encounter: Payer: Self-pay | Admitting: Cardiology

## 2018-10-05 VITALS — BP 134/76 | HR 87 | Ht 62.5 in | Wt 284.0 lb

## 2018-10-05 DIAGNOSIS — I10 Essential (primary) hypertension: Secondary | ICD-10-CM

## 2018-10-05 DIAGNOSIS — G4733 Obstructive sleep apnea (adult) (pediatric): Secondary | ICD-10-CM

## 2018-10-05 NOTE — Patient Instructions (Signed)

## 2018-11-05 DIAGNOSIS — Z111 Encounter for screening for respiratory tuberculosis: Secondary | ICD-10-CM | POA: Diagnosis not present

## 2018-11-08 DIAGNOSIS — D1801 Hemangioma of skin and subcutaneous tissue: Secondary | ICD-10-CM | POA: Diagnosis not present

## 2018-11-08 DIAGNOSIS — Z79899 Other long term (current) drug therapy: Secondary | ICD-10-CM | POA: Diagnosis not present

## 2018-11-08 DIAGNOSIS — D485 Neoplasm of uncertain behavior of skin: Secondary | ICD-10-CM | POA: Diagnosis not present

## 2018-11-08 DIAGNOSIS — L400 Psoriasis vulgaris: Secondary | ICD-10-CM | POA: Diagnosis not present

## 2018-11-08 DIAGNOSIS — Z85828 Personal history of other malignant neoplasm of skin: Secondary | ICD-10-CM | POA: Diagnosis not present

## 2018-11-08 DIAGNOSIS — C4372 Malignant melanoma of left lower limb, including hip: Secondary | ICD-10-CM | POA: Diagnosis not present

## 2018-11-08 DIAGNOSIS — L821 Other seborrheic keratosis: Secondary | ICD-10-CM | POA: Diagnosis not present

## 2018-11-13 DIAGNOSIS — L0109 Other impetigo: Secondary | ICD-10-CM | POA: Diagnosis not present

## 2018-11-13 DIAGNOSIS — Z85828 Personal history of other malignant neoplasm of skin: Secondary | ICD-10-CM | POA: Diagnosis not present

## 2018-11-13 DIAGNOSIS — I872 Venous insufficiency (chronic) (peripheral): Secondary | ICD-10-CM | POA: Diagnosis not present

## 2018-11-13 DIAGNOSIS — L0101 Non-bullous impetigo: Secondary | ICD-10-CM | POA: Diagnosis not present

## 2018-11-13 DIAGNOSIS — I8312 Varicose veins of left lower extremity with inflammation: Secondary | ICD-10-CM | POA: Diagnosis not present

## 2018-11-13 DIAGNOSIS — L239 Allergic contact dermatitis, unspecified cause: Secondary | ICD-10-CM | POA: Diagnosis not present

## 2018-11-13 DIAGNOSIS — L0889 Other specified local infections of the skin and subcutaneous tissue: Secondary | ICD-10-CM | POA: Diagnosis not present

## 2018-11-13 DIAGNOSIS — I8311 Varicose veins of right lower extremity with inflammation: Secondary | ICD-10-CM | POA: Diagnosis not present

## 2018-11-21 DIAGNOSIS — C4372 Malignant melanoma of left lower limb, including hip: Secondary | ICD-10-CM | POA: Diagnosis not present

## 2018-11-21 DIAGNOSIS — Z85828 Personal history of other malignant neoplasm of skin: Secondary | ICD-10-CM | POA: Diagnosis not present

## 2018-11-21 DIAGNOSIS — Z85820 Personal history of malignant melanoma of skin: Secondary | ICD-10-CM | POA: Diagnosis not present

## 2019-01-15 IMAGING — CT CT MAXILLOFACIAL W/O CM
3 of 7 series · 15 of 47 positions shown, 18 images · non-contrast
Comparison: None.

CLINICAL DATA: Trauma, fall.  Concussion.

EXAM:
CT MAXILLOFACIAL WITHOUT CONTRAST
TECHNIQUE: Multidetector CT imaging of the maxillofacial structures was
performed. Multiplanar CT image reconstructions were also generated.
A small metallic BB was placed on the right temple in order to
reliably differentiate right from left.

[Series 203: coronal st, idose (1) · coronal · 0.40mm/px · 3 of 76 slices shown]
[im 19/76  bone]
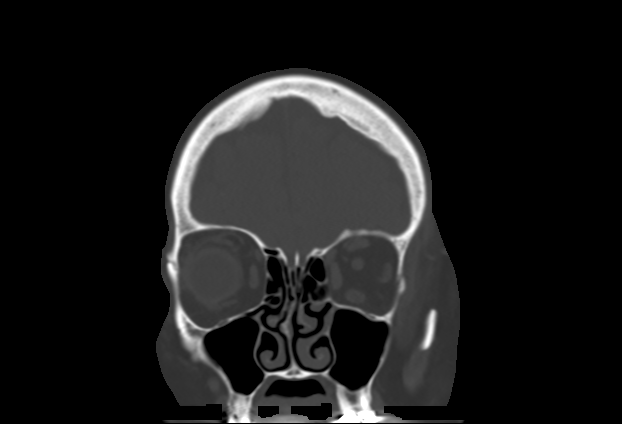
[im 38/76  bone]
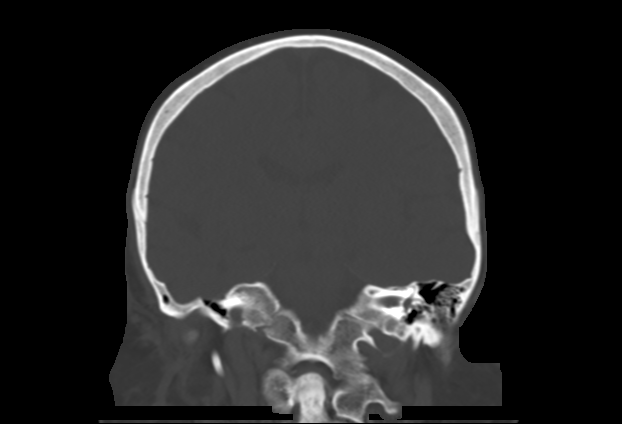
[im 57/76  bone]
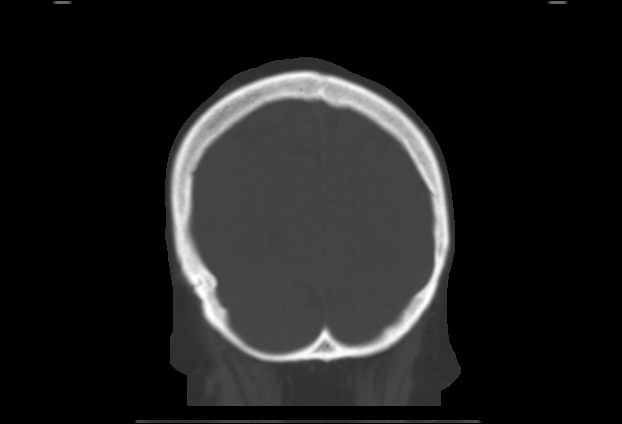

[Series 204: sagittal st, idose (1) · sagittal · 0.40mm/px · 1 of 83 slices shown]
[im 42/83  bone]
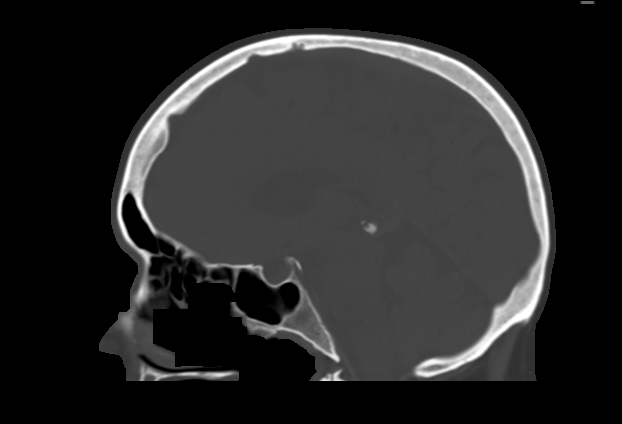

[Series 301: facial bones, idose (1) · axial · 0.35mm/px · z∈[+704,+840]mm · 11 of 82 slices shown, 14 images]
[im 7/82  brain]
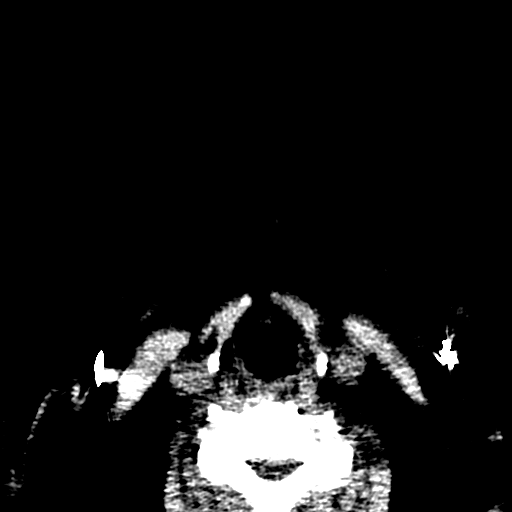
[im 7/82  bone]
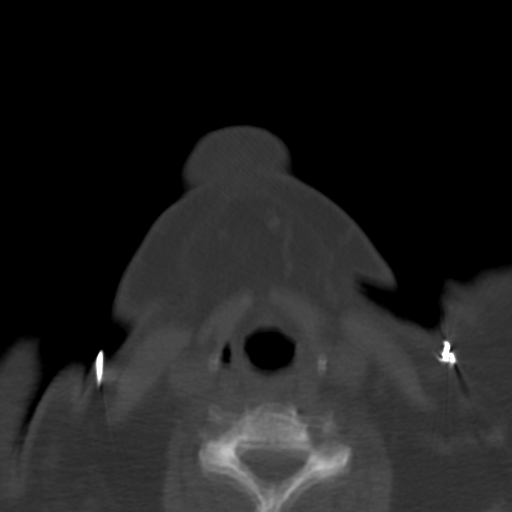
[im 14/82  bone]
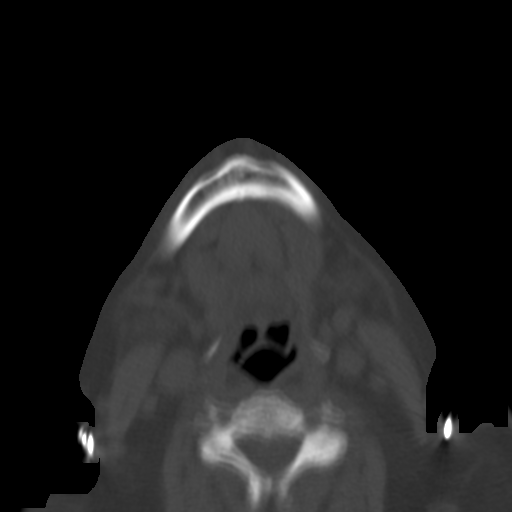
[im 21/82  bone]
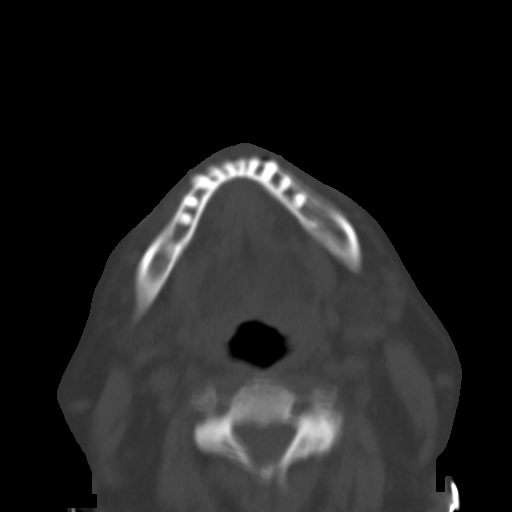
[im 28/82  bone]
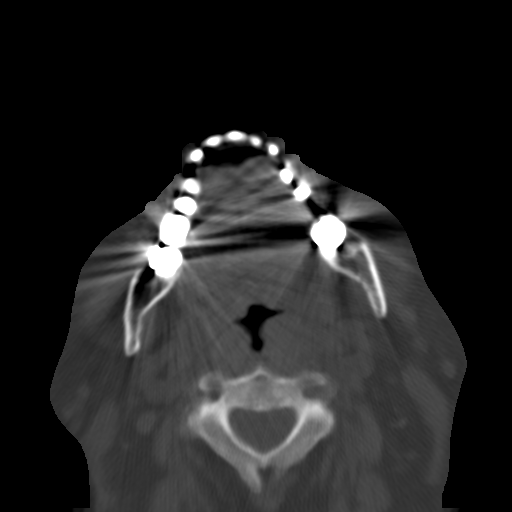
[im 34/82  brain]
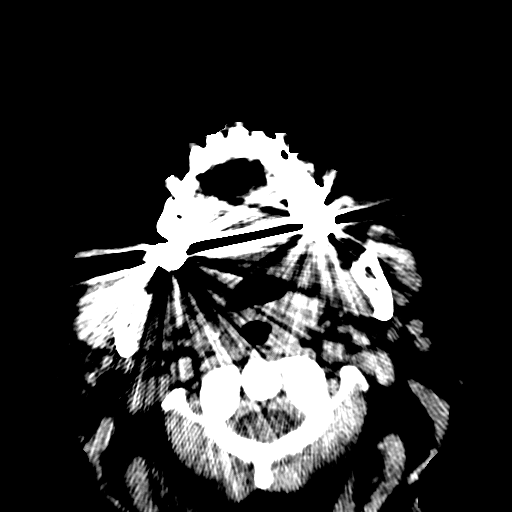
[im 34/82  bone]
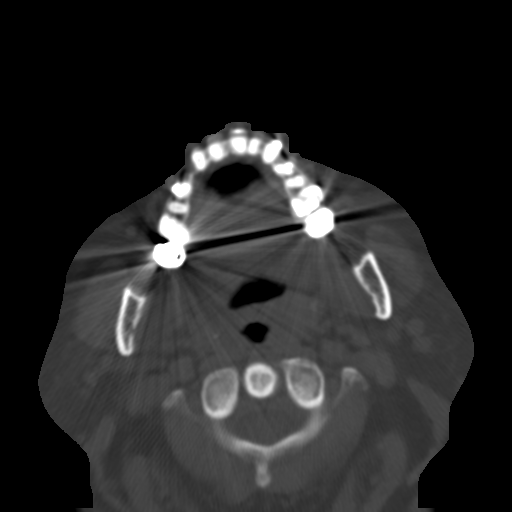
[im 41/82  bone]
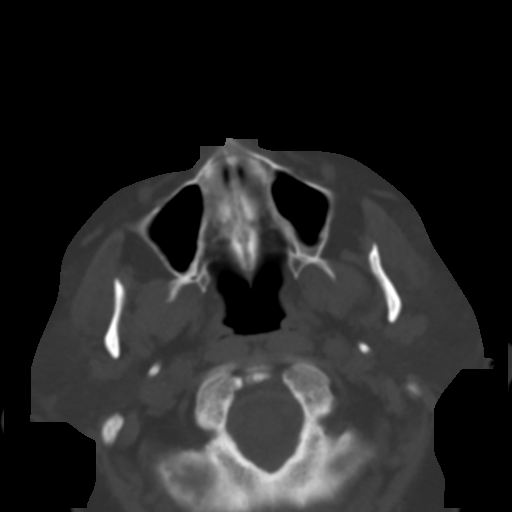
[im 48/82  bone]
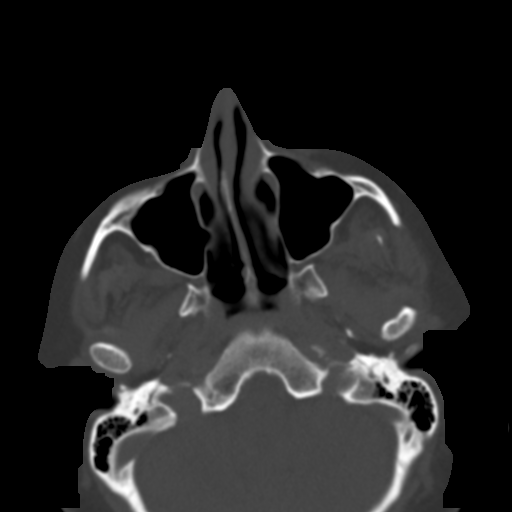
[im 55/82  bone]
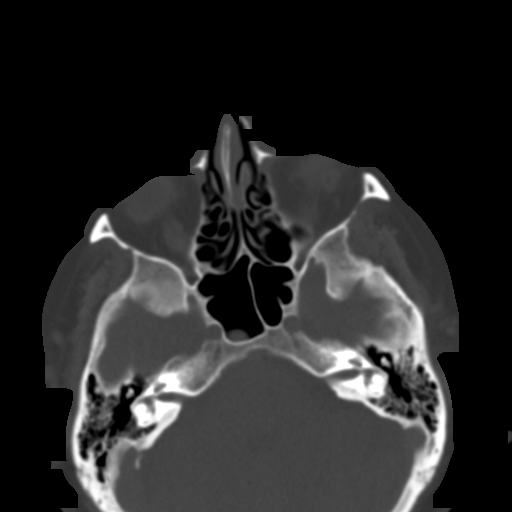
[im 61/82  brain]
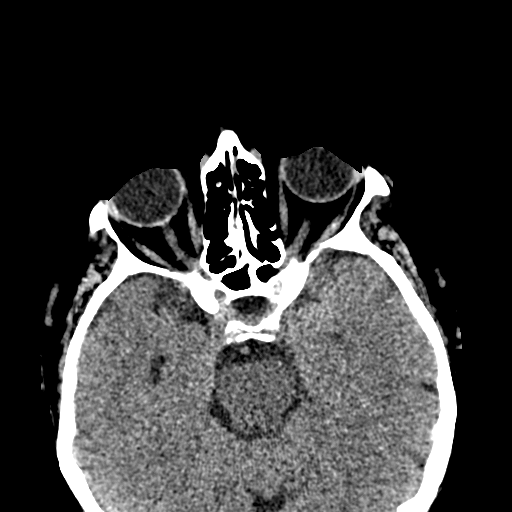
[im 61/82  bone]
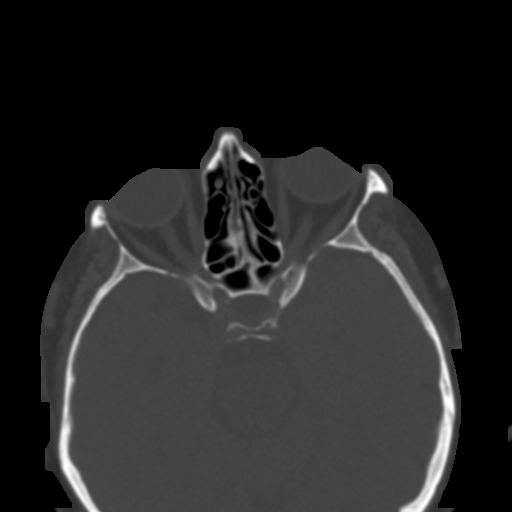
[im 68/82  bone]
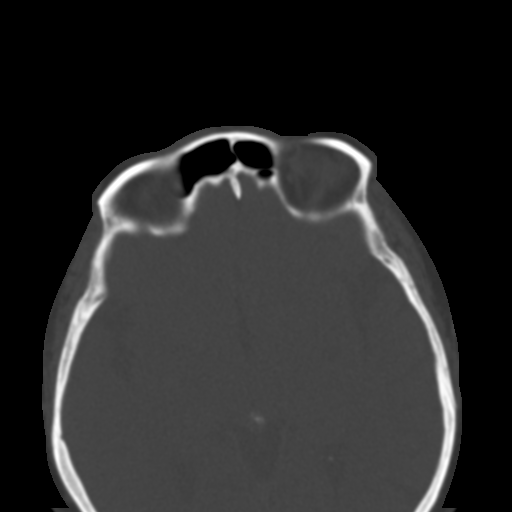
[im 75/82  bone]
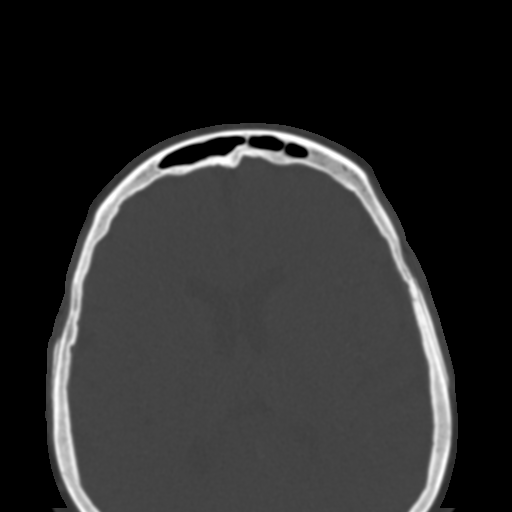

[15 of 47 positions shown; findings below may reference images not displayed]

FINDINGS: Study slightly limited by patient motion artifact.

Osseous: Lower frontal bones appear intact and normally aligned.
Osseous structures about the orbits appear intact and normally
aligned. No displaced nasal bone fracture. Walls of the maxillary
sinuses appear intact and normally aligned bilaterally. Bilateral
zygoma and pterygoid plates appear intact and normally aligned. No
fracture or dislocation seen within the mandible. Temporal bones
appear intact and normally aligned bilaterally.

Orbits: Negative. No traumatic or inflammatory finding.

Sinuses: Clear.

Soft tissues: Negative.
IMPRESSION: No facial bone fracture or displacement seen.

## 2019-02-22 DIAGNOSIS — D225 Melanocytic nevi of trunk: Secondary | ICD-10-CM | POA: Diagnosis not present

## 2019-02-22 DIAGNOSIS — Z85828 Personal history of other malignant neoplasm of skin: Secondary | ICD-10-CM | POA: Diagnosis not present

## 2019-02-22 DIAGNOSIS — L821 Other seborrheic keratosis: Secondary | ICD-10-CM | POA: Diagnosis not present

## 2019-02-22 DIAGNOSIS — D1801 Hemangioma of skin and subcutaneous tissue: Secondary | ICD-10-CM | POA: Diagnosis not present

## 2019-02-22 DIAGNOSIS — Z85820 Personal history of malignant melanoma of skin: Secondary | ICD-10-CM | POA: Diagnosis not present

## 2019-02-22 DIAGNOSIS — L400 Psoriasis vulgaris: Secondary | ICD-10-CM | POA: Diagnosis not present

## 2019-02-22 DIAGNOSIS — D2262 Melanocytic nevi of left upper limb, including shoulder: Secondary | ICD-10-CM | POA: Diagnosis not present

## 2019-02-22 DIAGNOSIS — D2261 Melanocytic nevi of right upper limb, including shoulder: Secondary | ICD-10-CM | POA: Diagnosis not present

## 2019-02-22 DIAGNOSIS — D2272 Melanocytic nevi of left lower limb, including hip: Secondary | ICD-10-CM | POA: Diagnosis not present

## 2019-04-15 DIAGNOSIS — M65321 Trigger finger, right index finger: Secondary | ICD-10-CM | POA: Diagnosis not present

## 2019-04-15 DIAGNOSIS — M79641 Pain in right hand: Secondary | ICD-10-CM | POA: Diagnosis not present

## 2019-07-11 DIAGNOSIS — M79644 Pain in right finger(s): Secondary | ICD-10-CM | POA: Diagnosis not present

## 2019-09-06 DIAGNOSIS — D2272 Melanocytic nevi of left lower limb, including hip: Secondary | ICD-10-CM | POA: Diagnosis not present

## 2019-09-06 DIAGNOSIS — Z85828 Personal history of other malignant neoplasm of skin: Secondary | ICD-10-CM | POA: Diagnosis not present

## 2019-09-06 DIAGNOSIS — L570 Actinic keratosis: Secondary | ICD-10-CM | POA: Diagnosis not present

## 2019-09-06 DIAGNOSIS — L400 Psoriasis vulgaris: Secondary | ICD-10-CM | POA: Diagnosis not present

## 2019-09-06 DIAGNOSIS — L814 Other melanin hyperpigmentation: Secondary | ICD-10-CM | POA: Diagnosis not present

## 2019-09-06 DIAGNOSIS — D225 Melanocytic nevi of trunk: Secondary | ICD-10-CM | POA: Diagnosis not present

## 2019-09-06 DIAGNOSIS — D1801 Hemangioma of skin and subcutaneous tissue: Secondary | ICD-10-CM | POA: Diagnosis not present

## 2019-09-06 DIAGNOSIS — Z79899 Other long term (current) drug therapy: Secondary | ICD-10-CM | POA: Diagnosis not present

## 2019-09-06 DIAGNOSIS — Z85820 Personal history of malignant melanoma of skin: Secondary | ICD-10-CM | POA: Diagnosis not present

## 2019-09-13 DIAGNOSIS — K219 Gastro-esophageal reflux disease without esophagitis: Secondary | ICD-10-CM | POA: Diagnosis not present

## 2019-09-13 DIAGNOSIS — R011 Cardiac murmur, unspecified: Secondary | ICD-10-CM | POA: Diagnosis not present

## 2019-09-13 DIAGNOSIS — L409 Psoriasis, unspecified: Secondary | ICD-10-CM | POA: Diagnosis not present

## 2019-09-13 DIAGNOSIS — G4733 Obstructive sleep apnea (adult) (pediatric): Secondary | ICD-10-CM | POA: Diagnosis not present

## 2019-09-13 DIAGNOSIS — Z85820 Personal history of malignant melanoma of skin: Secondary | ICD-10-CM | POA: Diagnosis not present

## 2019-09-13 DIAGNOSIS — E559 Vitamin D deficiency, unspecified: Secondary | ICD-10-CM | POA: Diagnosis not present

## 2019-09-13 DIAGNOSIS — I10 Essential (primary) hypertension: Secondary | ICD-10-CM | POA: Diagnosis not present

## 2019-09-13 DIAGNOSIS — J309 Allergic rhinitis, unspecified: Secondary | ICD-10-CM | POA: Diagnosis not present

## 2019-09-13 DIAGNOSIS — K2270 Barrett's esophagus without dysplasia: Secondary | ICD-10-CM | POA: Diagnosis not present

## 2019-09-13 DIAGNOSIS — H548 Legal blindness, as defined in USA: Secondary | ICD-10-CM | POA: Diagnosis not present

## 2019-09-13 DIAGNOSIS — E1169 Type 2 diabetes mellitus with other specified complication: Secondary | ICD-10-CM | POA: Diagnosis not present

## 2019-09-20 DIAGNOSIS — E1169 Type 2 diabetes mellitus with other specified complication: Secondary | ICD-10-CM | POA: Diagnosis not present

## 2019-10-24 ENCOUNTER — Telehealth: Payer: Self-pay | Admitting: *Deleted

## 2019-10-24 NOTE — Telephone Encounter (Signed)
Called pt re: Appt 10/25/2019 with Melina Copa, PA-C.  Appt has been cancelled due to pt needs to see Dr. Radford Pax for OSA, as APP's don't see OSA pt's. Asked pt to call the office.

## 2019-10-25 ENCOUNTER — Telehealth: Payer: 59 | Admitting: Physician Assistant

## 2019-11-07 ENCOUNTER — Encounter (INDEPENDENT_AMBULATORY_CARE_PROVIDER_SITE_OTHER): Payer: 59 | Admitting: Ophthalmology

## 2019-11-10 NOTE — Progress Notes (Addendum)
Virtual Visit via Telephone Note   This visit type was conducted due to national recommendations for restrictions regarding the COVID-19 Pandemic (e.g. social distancing) in an effort to limit this patient's exposure and mitigate transmission in our community.  Due to her co-morbid illnesses, this patient is at least at moderate risk for complications without adequate follow up.  This format is felt to be most appropriate for this patient at this time.  All issues noted in this document were discussed and addressed.  A limited physical exam was performed with this format.  Please refer to the patient's chart for her consent to telehealth for [facility].   Evaluation Performed:  Follow-up visit  This visit type was conducted due to national recommendations for restrictions regarding the COVID-19 Pandemic (e.g. social distancing).  This format is felt to be most appropriate for this patient at this time.  All issues noted in this document were discussed and addressed.  No physical exam was performed (except for noted visual exam findings with Video Visits).  Please refer to the patient's chart (MyChart message for video visits and phone note for telephone visits) for the patient's consent to telehealth for [facility].  Date:  11/11/2019   ID:  Kathryn Pittman, DOB 1955/10/25, MRN [id number]  Patient Location:  [place]  Provider location:   [place]  PCP:  Antony Contras, MD  Sleep Medicine:  Fransico Him, MD Electrophysiologist:  None   Chief Complaint:  OSA  History of Present Illness:    Kathryn Pittman is a 64 y.o. female who presents via audio/video conferencing for a telehealth visit today.    Kathryn Wygle Brownis a 64 y.o.femalewith a hx of severe OSA on BiPAP, HTN and obesity.  She is doing well with her CPAP device and thinks that she has gotten used to it.  She tolerates the full face mask and feels the pressure is adequate.  Since going on CPAP she feels rested in the am and  has no significant daytime sleepiness.  She has some problems with mouth dryness.  She does not think that he snores.    The patient does not have symptoms concerning for COVID-19 infection (fever, chills, cough, or new shortness of breath).    Prior CV studies:   The following studies were reviewed today:  PAP compliance download  Past Medical History:  Diagnosis Date  . Allergic rhinitis   . Anemia    hx of low iron  . Arthritis   . GERD (gastroesophageal reflux disease)    with Barretts esophagus  . Heart murmur    "slight"   . History of hiatal hernia    has been repaired  . History of kidney stones   . Hypertension   . Legally blind   . Macular degeneration, bilateral   . Morton's neuroma    Left  . Onychomycosis   . OSA (obstructive sleep apnea)    on BiPAP 167/13cm H2O  . Osteoarthritis, shoulder    right  . Psoriasis   . TMJ syndrome    Left>right   Past Surgical History:  Procedure Laterality Date  . ABDOMINAL HYSTERECTOMY    . CHOLECYSTECTOMY    . COLONOSCOPY    . COLONOSCOPY WITH PROPOFOL N/A 06/24/2016   Procedure: COLONOSCOPY WITH PROPOFOL;  Surgeon: Ronald Lobo, MD;  Location: [place];  Service: Endoscopy;  Laterality: N/A;  . ESOPHAGOGASTRODUODENOSCOPY (EGD) WITH PROPOFOL N/A 06/24/2016   Procedure: ESOPHAGOGASTRODUODENOSCOPY (EGD) WITH PROPOFOL;  Surgeon: Ronald Lobo,  MD;  Location: [place] ENDOSCOPY;  Service: Endoscopy;  Laterality: N/A;  . hiatal hernia surgery    . KNEE ARTHROSCOPY       Current Meds  Medication Sig  . Biotin w/ Vitamins C & E (HAIR/SKIN/NAILS PO) Take by mouth.  . cetirizine (ZYRTEC) 10 MG tablet Take 10 mg by mouth daily.  . fluticasone (FLONASE) 50 MCG/ACT nasal spray Place 2 sprays into both nostrils daily as needed for allergies.   Marland Kitchen lisinopril (PRINIVIL,ZESTRIL) 40 MG tablet Take 40 mg by mouth daily.  . pantoprazole (PROTONIX) 40 MG tablet Take 40 mg by mouth daily.  . SKYRIZI, 150 MG DOSE, 75 MG/0.83ML PSKT as  directed. Every 8 weeks  . vitamin B-12 (CYANOCOBALAMIN) 500 MCG tablet Take 500 mcg by mouth daily.     Allergies:   Excedrin extra strength [aspirin-acetaminophen-caffeine]   Social History   Tobacco Use  . Smoking status: Never Smoker  . Smokeless tobacco: Never Used  Substance Use Topics  . Alcohol use: No    Alcohol/week: 0.0 standard drinks  . Drug use: No     Family Hx: The patient's family history includes Heart attack in her mother.  ROS:   Please see the history of present illness.     All other systems reviewed and are negative.   Labs/Other Tests and Data Reviewed:    Recent Labs: No results found for requested labs within last 8760 hours.   Recent Lipid Panel No results found for: CHOL, TRIG, HDL, CHOLHDL, LDLCALC, LDLDIRECT  Wt Readings from Last 3 Encounters:  11/11/19 285 lb (129.3 kg)  10/05/18 284 lb (128.8 kg)  07/20/17 294 lb 6.4 oz (133.5 kg)     Objective:    Vital Signs:  BP 119/70   Pulse 74   Temp (!) 96 F (35.6 C)   Ht 5' 2.5" (1.588 m)   Wt 285 lb (129.3 kg)   BMI 51.30 kg/m     ASSESSMENT & PLAN:    1.  OSA -  The patient is tolerating PAP therapy well without any problems.  The patient has been using and benefiting from PAP use and will continue to benefit from therapy. I will get a download from her DME.   2.  HTN -BP controlled -continue Lisinopril 40mg  daily  3.  Obesity -I have encouraged her to get into a routine exercise program and cut back on carbs and portions.  -unfortunately she has hurt her knee and is getting an MRI so has not been able to exercise    COVID-19 Education: The signs and symptoms of COVID-19 were discussed with the patient and how to seek care for testing (follow up with PCP or arrange E-visit).  The importance of social distancing was discussed today.  Patient Risk:   After full review of this patient's clinical status, I feel that they are at least moderate risk at this time.  Time:    Today, I have spent 20 minutes on telemedicine discussing medical problems including OSA< HTN, Obesity and reviewing patient's chart including PAP compliance download.  Medication Adjustments/Labs and Tests Ordered: Current medicines are reviewed at length with the patient today.  Concerns regarding medicines are outlined above.  Tests Ordered: No orders of the defined types were placed in this encounter.  Medication Changes: No orders of the defined types were placed in this encounter.   Disposition:  Follow up in 1 year(s)  Signed, Fransico Him, MD  11/11/2019 8:42 AM    [facility]

## 2019-11-11 ENCOUNTER — Other Ambulatory Visit: Payer: Self-pay

## 2019-11-11 ENCOUNTER — Telehealth (INDEPENDENT_AMBULATORY_CARE_PROVIDER_SITE_OTHER): Payer: 59 | Admitting: Cardiology

## 2019-11-11 ENCOUNTER — Encounter: Payer: Self-pay | Admitting: Cardiology

## 2019-11-11 VITALS — BP 119/70 | HR 74 | Temp 96.0°F | Ht 62.5 in | Wt 285.0 lb

## 2019-11-11 DIAGNOSIS — G4733 Obstructive sleep apnea (adult) (pediatric): Secondary | ICD-10-CM

## 2019-11-11 DIAGNOSIS — I10 Essential (primary) hypertension: Secondary | ICD-10-CM | POA: Diagnosis not present

## 2019-11-12 ENCOUNTER — Telehealth: Payer: Self-pay | Admitting: *Deleted

## 2019-11-12 NOTE — Telephone Encounter (Signed)
Informed patient of compliance results and verbalized understanding was indicated. Patient is aware and agreeable to AHI being within range at 0.9. Patient is aware and agreeable to being in compliance with machine usage. Patient is aware and agreeable to no change in current pressures. 

## 2019-11-12 NOTE — Telephone Encounter (Signed)
-----   Message from Sueanne Margarita, MD sent at 11/11/2019  8:42 PM EDT ----- Regarding: RE: D/L Good AHI and compliance - please let patient know  Traci ----- Message ----- From: Freada Bergeron, CMA Sent: 11/11/2019   4:20 PM EDT To: Sueanne Margarita, MD Subject: Pat Patrick 10/12/2019 - 11/10/2019 Patient ID: [id number] DOB: [birth year] Age: 64 years [place] Compliance Report Usage 10/12/2019 - 11/10/2019 Usage days 30/30 days (100%) >= 4 hours 30 days (100%) < 4 hours 0 days (0%) Usage hours 250 hours 18 minutes Average usage (total days) 8 hours 21 minutes Average usage (days used) 8 hours 21 minutes Median usage (days used) 8 hours 2 minutes Total used hours (value since last reset - 11/10/2019) 12,961 hours AirCurve 10 VAuto Serial number [id number] Mode Spont IPAP 17 cmH2O EPAP 13 cmH2O Easy-Breathe On Therapy Leaks - L/min Median: 37.3 95th percentile: 65.1 Maximum: 77.2 Events per hour AI: 0.8 HI: 0.1 AHI: 0.9 Usage - hours Printed on 11/11/2019 - ResMed AirView version 4.23.0-2.0 Page 1 of 1 ----- Message ----- From: Sueanne Margarita, MD Sent: 11/11/2019   8:43 AM EDT To: Freada Bergeron, CMA  Please get download from DME

## 2019-11-14 ENCOUNTER — Encounter (INDEPENDENT_AMBULATORY_CARE_PROVIDER_SITE_OTHER): Payer: Self-pay | Admitting: Ophthalmology

## 2019-11-14 ENCOUNTER — Other Ambulatory Visit: Payer: Self-pay

## 2019-11-14 ENCOUNTER — Ambulatory Visit (INDEPENDENT_AMBULATORY_CARE_PROVIDER_SITE_OTHER): Payer: 59 | Admitting: Ophthalmology

## 2019-11-14 DIAGNOSIS — H2511 Age-related nuclear cataract, right eye: Secondary | ICD-10-CM | POA: Insufficient documentation

## 2019-11-14 DIAGNOSIS — H353134 Nonexudative age-related macular degeneration, bilateral, advanced atrophic with subfoveal involvement: Secondary | ICD-10-CM | POA: Diagnosis not present

## 2019-11-14 DIAGNOSIS — H2512 Age-related nuclear cataract, left eye: Secondary | ICD-10-CM | POA: Diagnosis not present

## 2019-11-14 DIAGNOSIS — H3553 Other dystrophies primarily involving the sensory retina: Secondary | ICD-10-CM | POA: Diagnosis not present

## 2019-11-14 NOTE — Progress Notes (Signed)
11/14/2019     CHIEF COMPLAINT Patient presents for Retina Follow Up   HISTORY OF PRESENT ILLNESS: Kathryn Pittman is a 64 y.o. female who presents to the clinic today for:   HPI    Retina Follow Up    Patient presents with  Dry AMD.  In both eyes.  Severity is moderate.  Duration of 2.  Since onset it is stable.  I, the attending physician,  performed the HPI with the patient and updated documentation appropriately.          Comments    2 Year AMD f\u OU. FP  Pt states vision is stable. Pt sees floaters and FOL in OU. BGL: 119 A1C: 6.9       Last edited by Tilda Franco on 11/14/2019  2:24 PM. (History)      Referring physician: Antony Contras, MD [place] [place],  [place] [id number]  HISTORICAL INFORMATION:   Selected notes from the [facility]: No current outpatient medications on file. (Ophthalmic Drugs)   No current facility-administered medications for this visit. (Ophthalmic Drugs)   Current Outpatient Medications (Other)  Medication Sig  . Biotin w/ Vitamins C & E (HAIR/SKIN/NAILS PO) Take by mouth.  . cetirizine (ZYRTEC) 10 MG tablet Take 10 mg by mouth daily.  . fluticasone (FLONASE) 50 MCG/ACT nasal spray Place 2 sprays into both nostrils daily as needed for allergies.   Marland Kitchen lisinopril (PRINIVIL,ZESTRIL) 40 MG tablet Take 40 mg by mouth daily.  . Multiple Vitamins-Minerals (VITEYES AREDS FORMULA/LUTEIN PO) Take 1 tablet by mouth daily.   . pantoprazole (PROTONIX) 40 MG tablet Take 40 mg by mouth daily.  . SKYRIZI, 150 MG DOSE, 75 MG/0.83ML PSKT as directed. Every 8 weeks  . vitamin B-12 (CYANOCOBALAMIN) 500 MCG tablet Take 500 mcg by mouth daily.   No current facility-administered medications for this visit. (Other)      REVIEW OF SYSTEMS: ROS    Positive for: Endocrine   Last edited by Tilda Franco on 11/14/2019  2:24 PM. (History)       ALLERGIES Allergies  Allergen Reactions    . Excedrin Extra Strength [Aspirin-Acetaminophen-Caffeine] Itching    PAST MEDICAL HISTORY Past Medical History:  Diagnosis Date  . Allergic rhinitis   . Anemia    hx of low iron  . Arthritis   . GERD (gastroesophageal reflux disease)    with Barretts esophagus  . Heart murmur    "slight"   . History of hiatal hernia    has been repaired  . History of kidney stones   . Hypertension   . Legally blind   . Macular degeneration, bilateral   . Morton's neuroma    Left  . Onychomycosis   . OSA (obstructive sleep apnea)    on BiPAP 167/13cm H2O  . Osteoarthritis, shoulder    right  . Psoriasis   . TMJ syndrome    Left>right   Past Surgical History:  Procedure Laterality Date  . ABDOMINAL HYSTERECTOMY    . CHOLECYSTECTOMY    . COLONOSCOPY    . COLONOSCOPY WITH PROPOFOL N/A 06/24/2016   Procedure: COLONOSCOPY WITH PROPOFOL;  Surgeon: Ronald Lobo, MD;  Location: [place];  Service: Endoscopy;  Laterality: N/A;  . ESOPHAGOGASTRODUODENOSCOPY (EGD) WITH PROPOFOL N/A 06/24/2016   Procedure: ESOPHAGOGASTRODUODENOSCOPY (EGD) WITH PROPOFOL;  Surgeon: Ronald Lobo, MD;  Location: [facility] ENDOSCOPY;  Service: Endoscopy;  Laterality: N/A;  .  hiatal hernia surgery    . KNEE ARTHROSCOPY      FAMILY HISTORY Family History  Problem Relation Age of Onset  . Heart attack Mother     SOCIAL HISTORY Social History   Tobacco Use  . Smoking status: Never Smoker  . Smokeless tobacco: Never Used  Substance Use Topics  . Alcohol use: No    Alcohol/week: 0.0 standard drinks  . Drug use: No         OPHTHALMIC EXAM: Base Eye Exam    Visual Acuity (Snellen - Linear)      Right Left   Dist [place] 20/400 20/400   Dist ph [place] NI NI       Tonometry (Tonopen, 2:30 PM)      Right Left   Pressure 24 24       Pupils      Pupils Dark Light Shape React APD   Right PERRL 6 4 Round Brisk None   Left PERRL 6 4 Round Brisk None       Visual Fields (Counting fingers)      Left Right     Full    Restrictions  Partial outer superior temporal, inferior temporal, superior nasal, inferior nasal deficiencies       Neuro/Psych    Oriented x3: Yes   Mood/Affect: Normal       Dilation    Both eyes: 1.0% Mydriacyl, 2.5% Phenylephrine @ 2:30 PM          IMAGING AND PROCEDURES  Imaging and Procedures for 11/14/19           ASSESSMENT/PLAN:  No problem-specific Assessment & Plan notes found for this encounter.      ICD-10-CM   1. Stargardt's disease  H35.53 Color Fundus Photography Optos - OU - Both Eyes  2. [place] dysfunction syndrome  H35.53   3. Advanced nonexudative age-related macular degeneration of both eyes with subfoveal involvement  H35.3134 Color Fundus Photography Optos - OU - Both Eyes  4. Nuclear sclerotic cataract of left eye  H25.12   5. Nuclear sclerotic cataract of right eye  H25.11     1.  2.  3.  Ophthalmic Meds Ordered this visit:  No orders of the defined types were placed in this encounter.      No follow-ups on file.  There are no Patient Instructions on file for this visit.   Explained the diagnoses, plan, and follow up with the patient and they expressed understanding.  Patient expressed understanding of the importance of proper follow up care.   Clent Demark Glover Capano M.D. Diseases & Surgery of the Retina and Vitreous Retina & Diabetic [place] 11/14/19     Abbreviations: M myopia (nearsighted); A astigmatism; H hyperopia (farsighted); P presbyopia; Mrx spectacle prescription;  CTL contact lenses; OD right eye; OS left eye; OU both eyes  XT exotropia; ET esotropia; PEK punctate epithelial keratitis; PEE punctate epithelial erosions; DES dry eye syndrome; MGD meibomian gland dysfunction; ATs artificial tears; PFAT's preservative free artificial tears; [place] nuclear sclerotic cataract; PSC posterior subcapsular cataract; ERM epi-retinal membrane; PVD posterior vitreous detachment; RD retinal detachment; DM diabetes mellitus; DR  diabetic retinopathy; NPDR non-proliferative diabetic retinopathy; PDR proliferative diabetic retinopathy; CSME clinically significant macular edema; DME diabetic macular edema; dbh dot blot hemorrhages; CWS cotton wool spot; POAG primary open angle glaucoma; C/D cup-to-disc ratio; HVF humphrey visual field; GVF goldmann visual field; OCT optical coherence tomography; IOP intraocular pressure; BRVO Branch retinal vein occlusion; CRVO central retinal vein occlusion;  CRAO central retinal artery occlusion; BRAO branch retinal artery occlusion; RT retinal tear; SB scleral buckle; PPV pars plana vitrectomy; VH Vitreous hemorrhage; PRP panretinal laser photocoagulation; IVK intravitreal kenalog; VMT vitreomacular traction; MH Macular hole;  NVD neovascularization of the disc; NVE neovascularization elsewhere; AREDS age related eye disease study; ARMD age related macular degeneration; POAG primary open angle glaucoma; EBMD epithelial/anterior basement membrane dystrophy; ACIOL anterior chamber intraocular lens; IOL intraocular lens; PCIOL posterior chamber intraocular lens; Phaco/IOL phacoemulsification with intraocular lens placement; [place] photorefractive keratectomy; LASIK laser assisted in situ keratomileusis; HTN hypertension; DM diabetes mellitus; COPD chronic obstructive pulmonary disease

## 2019-11-14 NOTE — Assessment & Plan Note (Signed)
Overall OU stable.  No significant progression in the macular atrophy.

## 2020-08-03 DIAGNOSIS — Z1231 Encounter for screening mammogram for malignant neoplasm of breast: Secondary | ICD-10-CM | POA: Diagnosis not present

## 2020-08-24 ENCOUNTER — Telehealth: Payer: Self-pay | Admitting: Cardiology

## 2020-08-24 NOTE — Telephone Encounter (Signed)
Patient wants to know if Dr. Radford Pax is able to see her downloads for her cpap machine. She states that her husband's downloads are not going through and she wants to make sure hers are. She states she can bring her SD card to the office.

## 2020-08-25 DIAGNOSIS — G4733 Obstructive sleep apnea (adult) (pediatric): Secondary | ICD-10-CM | POA: Diagnosis not present

## 2020-09-01 NOTE — Telephone Encounter (Signed)
Called patient to say yes dr Radford Pax can see her download but the phone had a block on it and I was not able to speak to anyone. Called her husbands number and was told it was a wrong number.

## 2020-09-17 DIAGNOSIS — E559 Vitamin D deficiency, unspecified: Secondary | ICD-10-CM | POA: Diagnosis not present

## 2020-09-17 DIAGNOSIS — R011 Cardiac murmur, unspecified: Secondary | ICD-10-CM | POA: Diagnosis not present

## 2020-09-17 DIAGNOSIS — J309 Allergic rhinitis, unspecified: Secondary | ICD-10-CM | POA: Diagnosis not present

## 2020-09-17 DIAGNOSIS — K219 Gastro-esophageal reflux disease without esophagitis: Secondary | ICD-10-CM | POA: Diagnosis not present

## 2020-09-17 DIAGNOSIS — G4733 Obstructive sleep apnea (adult) (pediatric): Secondary | ICD-10-CM | POA: Diagnosis not present

## 2020-09-17 DIAGNOSIS — E1169 Type 2 diabetes mellitus with other specified complication: Secondary | ICD-10-CM | POA: Diagnosis not present

## 2020-09-17 DIAGNOSIS — Z7984 Long term (current) use of oral hypoglycemic drugs: Secondary | ICD-10-CM | POA: Diagnosis not present

## 2020-09-17 DIAGNOSIS — I10 Essential (primary) hypertension: Secondary | ICD-10-CM | POA: Diagnosis not present

## 2020-09-17 DIAGNOSIS — H548 Legal blindness, as defined in USA: Secondary | ICD-10-CM | POA: Diagnosis not present

## 2020-09-17 DIAGNOSIS — L409 Psoriasis, unspecified: Secondary | ICD-10-CM | POA: Diagnosis not present

## 2020-09-17 DIAGNOSIS — K2270 Barrett's esophagus without dysplasia: Secondary | ICD-10-CM | POA: Diagnosis not present

## 2020-09-22 DIAGNOSIS — Z85828 Personal history of other malignant neoplasm of skin: Secondary | ICD-10-CM | POA: Diagnosis not present

## 2020-09-22 DIAGNOSIS — R7303 Prediabetes: Secondary | ICD-10-CM | POA: Diagnosis not present

## 2020-09-22 DIAGNOSIS — Z7984 Long term (current) use of oral hypoglycemic drugs: Secondary | ICD-10-CM | POA: Diagnosis not present

## 2020-09-22 DIAGNOSIS — Z6841 Body Mass Index (BMI) 40.0 and over, adult: Secondary | ICD-10-CM | POA: Diagnosis not present

## 2020-09-22 DIAGNOSIS — I10 Essential (primary) hypertension: Secondary | ICD-10-CM | POA: Diagnosis not present

## 2020-09-23 DIAGNOSIS — G4733 Obstructive sleep apnea (adult) (pediatric): Secondary | ICD-10-CM | POA: Diagnosis not present

## 2020-11-11 ENCOUNTER — Telehealth: Payer: Self-pay | Admitting: *Deleted

## 2020-11-11 ENCOUNTER — Encounter: Payer: Self-pay | Admitting: Cardiology

## 2020-11-11 ENCOUNTER — Telehealth (INDEPENDENT_AMBULATORY_CARE_PROVIDER_SITE_OTHER): Payer: Medicare HMO | Admitting: Cardiology

## 2020-11-11 ENCOUNTER — Other Ambulatory Visit: Payer: Self-pay

## 2020-11-11 VITALS — BP 134/65 | HR 102 | Ht 62.5 in | Wt 277.0 lb

## 2020-11-11 DIAGNOSIS — G4733 Obstructive sleep apnea (adult) (pediatric): Secondary | ICD-10-CM | POA: Diagnosis not present

## 2020-11-11 DIAGNOSIS — I10 Essential (primary) hypertension: Secondary | ICD-10-CM

## 2020-11-11 NOTE — Progress Notes (Signed)
Virtual Visit via Video Note   This visit type was conducted due to national recommendations for restrictions regarding the COVID-19 Pandemic (e.g. social distancing) in an effort to limit this patient's exposure and mitigate transmission in our community.  Due to her co-morbid illnesses, this patient is at least at moderate risk for complications without adequate follow up.  This format is felt to be most appropriate for this patient at this time.  All issues noted in this document were discussed and addressed.  A limited physical exam was performed with this format.  Please refer to the patient's chart for her consent to telehealth for [facility].   Evaluation Performed:  Follow-up visit  This visit type was conducted due to national recommendations for restrictions regarding the COVID-19 Pandemic (e.g. social distancing).  This format is felt to be most appropriate for this patient at this time.  All issues noted in this document were discussed and addressed.  No physical exam was performed (except for noted visual exam findings with Video Visits).  Please refer to the patient's chart (MyChart message for video visits and phone note for telephone visits) for the patient's consent to telehealth for [facility].  Date:  11/11/2020   ID:  Kathryn Pittman, DOB Nov 17, 1955, MRN [id number]  Patient Location:  Home  Provider location:   [place]  PCP:  Antony Contras, MD  Sleep Medicine:  Fransico Him, MD Electrophysiologist:  None   Chief Complaint:  OSA, morbid obesity, HTN  History of Present Illness:    Kathryn Pittman is a 65 y.o. female who presents via audio/video conferencing for a telehealth visit today.    Kathryn Trabucco Brownis a 65 y.o.femalewith a hx of severe OSA on BiPAP, HTN and obesity.  She is doing well with her CPAP device and thinks that she has gotten used to it.  She tolerates the full face mask and feels the pressure is adequate.  Since going on CPAP she feels  rested in the am and has no significant daytime sleepiness if she sleeps well the night before.  She has some problems with mouth dryness.  She does not think that he snores.    The patient does not have symptoms concerning for COVID-19 infection (fever, chills, cough, or new shortness of breath).    Prior CV studies:   The following studies were reviewed today:  PAP compliance download  Past Medical History:  Diagnosis Date  . Allergic rhinitis   . Anemia    hx of low iron  . Arthritis   . GERD (gastroesophageal reflux disease)    with Barretts esophagus  . Heart murmur    "slight"   . History of hiatal hernia    has been repaired  . History of kidney stones   . Hypertension   . Legally blind   . Macular degeneration, bilateral   . Morton's neuroma    Left  . Onychomycosis   . OSA (obstructive sleep apnea)    on BiPAP 167/13cm H2O  . Osteoarthritis, shoulder    right  . Psoriasis   . TMJ syndrome    Left>right   Past Surgical History:  Procedure Laterality Date  . ABDOMINAL HYSTERECTOMY    . CHOLECYSTECTOMY    . COLONOSCOPY    . COLONOSCOPY WITH PROPOFOL N/A 06/24/2016   Procedure: COLONOSCOPY WITH PROPOFOL;  Surgeon: Ronald Lobo, MD;  Location: [facility];  Service: Endoscopy;  Laterality: N/A;  . ESOPHAGOGASTRODUODENOSCOPY (EGD) WITH PROPOFOL N/A 06/24/2016  Procedure: ESOPHAGOGASTRODUODENOSCOPY (EGD) WITH PROPOFOL;  Surgeon: Ronald Lobo, MD;  Location: [facility] ENDOSCOPY;  Service: Endoscopy;  Laterality: N/A;  . hiatal hernia surgery    . KNEE ARTHROSCOPY       Current Meds  Medication Sig  . Biotin w/ Vitamins C & E (HAIR/SKIN/NAILS PO) Take by mouth.  . cetirizine (ZYRTEC) 10 MG tablet Take 10 mg by mouth daily.  . fluticasone (FLONASE) 50 MCG/ACT nasal spray Place 2 sprays into both nostrils daily as needed for allergies.   Marland Kitchen lisinopril (PRINIVIL,ZESTRIL) 40 MG tablet Take 40 mg by mouth daily.  . Multiple Vitamins-Minerals (VITEYES AREDS  FORMULA/LUTEIN PO) Take 1 tablet by mouth daily.   . Nutritional Supplements (IMMUNE ENHANCE PO) Take by mouth.  . pantoprazole (PROTONIX) 40 MG tablet Take 40 mg by mouth daily.  . SKYRIZI, 150 MG DOSE, 75 MG/0.83ML PSKT as directed. Every 8 weeks  . vitamin B-12 (CYANOCOBALAMIN) 500 MCG tablet Take 500 mcg by mouth daily.     Allergies:   Excedrin extra strength [aspirin-acetaminophen-caffeine]   Social History   Tobacco Use  . Smoking status: Never Smoker  . Smokeless tobacco: Never Used  Vaping Use  . Vaping Use: Never used  Substance Use Topics  . Alcohol use: No    Alcohol/week: 0.0 standard drinks  . Drug use: No     Family Hx: The patient's family history includes Heart attack in her mother.  ROS:   Please see the history of present illness.     All other systems reviewed and are negative.   Labs/Other Tests and Data Reviewed:    Recent Labs: No results found for requested labs within last 8760 hours.   Recent Lipid Panel No results found for: CHOL, TRIG, HDL, CHOLHDL, LDLCALC, LDLDIRECT  Wt Readings from Last 3 Encounters:  11/11/20 277 lb (125.6 kg)  11/11/19 285 lb (129.3 kg)  10/05/18 284 lb (128.8 kg)     Objective:    Vital Signs:  BP 134/65   Pulse (!) 102   Ht 5' 2.5" (1.588 m)   Wt 277 lb (125.6 kg)   SpO2 93%   BMI 49.86 kg/m   Well nourished, well developed female in no acute distress. Well appearing, alert and conversant, regular work of breathing,  good skin color  Eyes- anicteric mouth- oral mucosa is pink  neuro- grossly intact skin- no apparent rash or lesions or cyanosis   ASSESSMENT & PLAN:    1.  OSA - The patient is tolerating PAP therapy well without any problems. The PAP download was reviewed today and showed an AHI of 1/hr on 17/13 cm H2O with 100% compliance in using more than 4 hours nightly.  The patient has been using and benefiting from PAP use and will continue to benefit from therapy.  -her device is over 79 years  old and we cannot get downloads on airview -I will order her a new PAP device and she will see me back 6 weeks after she gets her new device  2.  HTN -BP controlled -continue Lisinopril 40mg  daily  3.  Obesity -Her BMI is very high still and I talked to her about the importance of her trying to get her weight down -I have encouraged her to get into a routine exercise program and cut back on carbs and portions.  COVID-19 Education: The signs and symptoms of COVID-19 were discussed with the patient and how to seek care for testing (follow up with PCP or arrange E-visit).  The importance of social distancing was discussed today.  Patient Risk:   After full review of this patient's clinical status, I feel that they are at least moderate risk at this time.  Time:   Today, I have spent 20 minutes on telemedicine discussing medical problems including OSA, HTN, Obesity and reviewing patient's chart including PAP compliance download.  Medication Adjustments/Labs and Tests Ordered: Current medicines are reviewed at length with the patient today.  Concerns regarding medicines are outlined above.  Tests Ordered: No orders of the defined types were placed in this encounter.  Medication Changes: No orders of the defined types were placed in this encounter.   Disposition:  Follow up with me in person 6 weeks after he gets her device  Signed, Fransico Him, MD  11/11/2020 9:28 AM    [place]

## 2020-11-11 NOTE — Telephone Encounter (Signed)
-----   Message from Sueanne Margarita, MD sent at 11/11/2020  9:36 AM EDT ----- Please order a new Resmed BiPAP at 17/13cm H2O with heated humidity and mask of choice and followup with me 6 weeks after she gets her new device.

## 2020-11-11 NOTE — Telephone Encounter (Signed)
Kathryn Margarita, MD  Freada Bergeron, CMA Please order a climate tubing for patient's PAP device

## 2020-11-11 NOTE — Telephone Encounter (Signed)
Kathryn Margarita, MD  Freada Bergeron, CMA Follow up with me in person 6 weeks after he gets her device

## 2020-11-18 ENCOUNTER — Telehealth: Payer: Self-pay | Admitting: *Deleted

## 2020-11-18 NOTE — Telephone Encounter (Signed)
The patient has been notified of the result and verbalized understanding.  All questions (if any) were answered. Patient understands his AHI showed normal at 1.4. Pt is aware and agreeable to normal results.

## 2020-11-18 NOTE — Telephone Encounter (Signed)
-----   Message from Sueanne Margarita, MD sent at 11/10/2020  4:49 PM EDT ----- Good AHI and compliance.  Continue current PAP settings.

## 2020-12-01 DIAGNOSIS — G4733 Obstructive sleep apnea (adult) (pediatric): Secondary | ICD-10-CM | POA: Diagnosis not present

## 2020-12-09 DIAGNOSIS — Z111 Encounter for screening for respiratory tuberculosis: Secondary | ICD-10-CM | POA: Diagnosis not present

## 2020-12-10 DIAGNOSIS — G4733 Obstructive sleep apnea (adult) (pediatric): Secondary | ICD-10-CM | POA: Diagnosis not present

## 2020-12-11 DIAGNOSIS — L400 Psoriasis vulgaris: Secondary | ICD-10-CM | POA: Diagnosis not present

## 2020-12-11 DIAGNOSIS — Z85820 Personal history of malignant melanoma of skin: Secondary | ICD-10-CM | POA: Diagnosis not present

## 2020-12-11 DIAGNOSIS — L821 Other seborrheic keratosis: Secondary | ICD-10-CM | POA: Diagnosis not present

## 2020-12-11 DIAGNOSIS — D2261 Melanocytic nevi of right upper limb, including shoulder: Secondary | ICD-10-CM | POA: Diagnosis not present

## 2020-12-11 DIAGNOSIS — D2272 Melanocytic nevi of left lower limb, including hip: Secondary | ICD-10-CM | POA: Diagnosis not present

## 2020-12-11 DIAGNOSIS — L814 Other melanin hyperpigmentation: Secondary | ICD-10-CM | POA: Diagnosis not present

## 2020-12-11 DIAGNOSIS — Z85828 Personal history of other malignant neoplasm of skin: Secondary | ICD-10-CM | POA: Diagnosis not present

## 2020-12-11 DIAGNOSIS — D225 Melanocytic nevi of trunk: Secondary | ICD-10-CM | POA: Diagnosis not present

## 2020-12-11 DIAGNOSIS — L720 Epidermal cyst: Secondary | ICD-10-CM | POA: Diagnosis not present

## 2020-12-11 DIAGNOSIS — D1801 Hemangioma of skin and subcutaneous tissue: Secondary | ICD-10-CM | POA: Diagnosis not present

## 2020-12-31 DIAGNOSIS — G4733 Obstructive sleep apnea (adult) (pediatric): Secondary | ICD-10-CM | POA: Diagnosis not present

## 2021-01-31 DIAGNOSIS — G4733 Obstructive sleep apnea (adult) (pediatric): Secondary | ICD-10-CM | POA: Diagnosis not present

## 2021-03-03 DIAGNOSIS — G4733 Obstructive sleep apnea (adult) (pediatric): Secondary | ICD-10-CM | POA: Diagnosis not present

## 2021-03-15 DIAGNOSIS — G4733 Obstructive sleep apnea (adult) (pediatric): Secondary | ICD-10-CM | POA: Diagnosis not present

## 2021-03-26 DIAGNOSIS — U071 COVID-19: Secondary | ICD-10-CM | POA: Diagnosis not present

## 2021-03-31 ENCOUNTER — Ambulatory Visit: Payer: Medicare HMO | Admitting: Cardiology

## 2021-04-02 DIAGNOSIS — G4733 Obstructive sleep apnea (adult) (pediatric): Secondary | ICD-10-CM | POA: Diagnosis not present

## 2021-05-03 DIAGNOSIS — G4733 Obstructive sleep apnea (adult) (pediatric): Secondary | ICD-10-CM | POA: Diagnosis not present

## 2021-05-06 DIAGNOSIS — Z0000 Encounter for general adult medical examination without abnormal findings: Secondary | ICD-10-CM | POA: Diagnosis not present

## 2021-05-06 DIAGNOSIS — K2270 Barrett's esophagus without dysplasia: Secondary | ICD-10-CM | POA: Diagnosis not present

## 2021-05-06 DIAGNOSIS — I10 Essential (primary) hypertension: Secondary | ICD-10-CM | POA: Diagnosis not present

## 2021-05-06 DIAGNOSIS — K219 Gastro-esophageal reflux disease without esophagitis: Secondary | ICD-10-CM | POA: Diagnosis not present

## 2021-05-06 DIAGNOSIS — Z1159 Encounter for screening for other viral diseases: Secondary | ICD-10-CM | POA: Diagnosis not present

## 2021-05-06 DIAGNOSIS — Z7984 Long term (current) use of oral hypoglycemic drugs: Secondary | ICD-10-CM | POA: Diagnosis not present

## 2021-05-06 DIAGNOSIS — Z8616 Personal history of COVID-19: Secondary | ICD-10-CM | POA: Diagnosis not present

## 2021-05-06 DIAGNOSIS — E2839 Other primary ovarian failure: Secondary | ICD-10-CM | POA: Diagnosis not present

## 2021-05-06 DIAGNOSIS — Z23 Encounter for immunization: Secondary | ICD-10-CM | POA: Diagnosis not present

## 2021-05-06 DIAGNOSIS — E559 Vitamin D deficiency, unspecified: Secondary | ICD-10-CM | POA: Diagnosis not present

## 2021-05-06 DIAGNOSIS — E1169 Type 2 diabetes mellitus with other specified complication: Secondary | ICD-10-CM | POA: Diagnosis not present

## 2021-05-16 NOTE — Progress Notes (Signed)
Virtual Visit via Video Note   This visit type was conducted due to national recommendations for restrictions regarding the COVID-19 Pandemic (e.g. social distancing) in an effort to limit this patient's exposure and mitigate transmission in our community.  Due to her co-morbid illnesses, this patient is at least at moderate risk for complications without adequate follow up.  This format is felt to be most appropriate for this patient at this time.  All issues noted in this document were discussed and addressed.  A limited physical exam was performed with this format.  Please refer to the patient's chart for her consent to telehealth for [facility].   Evaluation Performed:  Follow-up visit  This visit type was conducted due to national recommendations for restrictions regarding the COVID-19 Pandemic (e.g. social distancing).  This format is felt to be most appropriate for this patient at this time.  All issues noted in this document were discussed and addressed.  No physical exam was performed (except for noted visual exam findings with Video Visits).  Please refer to the patient's chart (MyChart message for video visits and phone note for telephone visits) for the patient's consent to telehealth for [facility].  Date:  05/17/2021   ID:  Mayer Masker, DOB [birth year], MRN [id number]  Patient Location:  Home  Provider location:   [place]  PCP:  Antony Contras, MD  Sleep Medicine:  Fransico Him, MD Electrophysiologist:  None   Chief Complaint:  OSA, morbid obesity, HTN SHe History of Present Illness:    Kathryn Pittman is a 65 y.o. female who presents via audio/video conferencing for a telehealth visit today.    Kathryn Pittman is a 65 y.o. female with a hx of severe OSA on BiPAP, HTN and obesity.  She is doing well with her CPAP device and thinks that she has gotten used to it.  She tolerates the FF mask and feels the pressure is adequate.  Since going on CPAP she feels rested  in the am and has no significant daytime sleepiness.  She has some mouth and nasal dryness but no nasal congestion.  She does not think that he snores.     The patient does not have symptoms concerning for COVID-19 infection (fever, chills, cough, or new shortness of breath).    Prior CV studies:   The following studies were reviewed today:  PAP compliance download  Past Medical History:  Diagnosis Date   Allergic rhinitis    Anemia    hx of low iron   Arthritis    GERD (gastroesophageal reflux disease)    with Barretts esophagus   Heart murmur    "slight"    History of hiatal hernia    has been repaired   History of kidney stones    Hypertension    Legally blind    Macular degeneration, bilateral    Morton's neuroma    Left   Onychomycosis    OSA (obstructive sleep apnea)    on BiPAP 167/13cm H2O   Osteoarthritis, shoulder    right   Psoriasis    TMJ syndrome    Left>right   Past Surgical History:  Procedure Laterality Date   ABDOMINAL HYSTERECTOMY     CHOLECYSTECTOMY     COLONOSCOPY     COLONOSCOPY WITH PROPOFOL N/A 06/24/2016   Procedure: COLONOSCOPY WITH PROPOFOL;  Surgeon: Ronald Lobo, MD;  Location[facility] ENDOSCOPY;  Service: Endoscopy;  Laterality: N/A;   ESOPHAGOGASTRODUODENOSCOPY (EGD) WITH PROPOFOL N/A 06/24/2016  Procedure: ESOPHAGOGASTRODUODENOSCOPY (EGD) WITH PROPOFOL;  Surgeon: Ronald Lobo, MD;  Location: [facility] ENDOSCOPY;  Service: Endoscopy;  Laterality: N/A;   hiatal hernia surgery     KNEE ARTHROSCOPY       Current Meds  Medication Sig   Biotin w/ Vitamins C & E (HAIR/SKIN/NAILS PO) Take by mouth.   cetirizine (ZYRTEC) 10 MG tablet Take 10 mg by mouth daily.   fluticasone (FLONASE) 50 MCG/ACT nasal spray Place 2 sprays into both nostrils daily as needed for allergies.    lisinopril (PRINIVIL,ZESTRIL) 40 MG tablet Take 40 mg by mouth daily.   Multiple Vitamins-Minerals (VITEYES AREDS FORMULA/LUTEIN PO) Take 1 tablet by mouth daily.     Nutritional Supplements (IMMUNE ENHANCE PO) Take by mouth.   pantoprazole (PROTONIX) 40 MG tablet Take 40 mg by mouth daily.   SKYRIZI, 150 MG DOSE, 75 MG/0.83ML PSKT as directed. Every 12 weeks   vitamin B-12 (CYANOCOBALAMIN) 500 MCG tablet Take 500 mcg by mouth daily.     Allergies:   Excedrin extra strength [aspirin-acetaminophen-caffeine]   Social History   Tobacco Use   Smoking status: Never   Smokeless tobacco: Never  Vaping Use   Vaping Use: Never used  Substance Use Topics   Alcohol use: No    Alcohol/week: 0.0 standard drinks   Drug use: No     Family Hx: The patient's family history includes Heart attack in her mother.  ROS:   Please see the history of present illness.     All other systems reviewed and are negative.   Labs/Other Tests and Data Reviewed:    Recent Labs: No results found for requested labs within last 8760 hours.   Recent Lipid Panel No results found for: CHOL, TRIG, HDL, CHOLHDL, LDLCALC, LDLDIRECT  Wt Readings from Last 3 Encounters:  05/17/21 269 lb (122 kg)  11/11/20 277 lb (125.6 kg)  11/11/19 285 lb (129.3 kg)     Objective:    Vital Signs:  BP 117/70   Pulse 74   Temp (!) 96.3 F (35.7 C)   Ht 5' 2.5" (1.588 m)   Wt 269 lb (122 kg)   SpO2 95%   BMI 48.42 kg/m   Well nourished, well developed female in no acute distress. Well appearing, alert and conversant, regular work of breathing,  good skin color  Eyes- anicteric mouth- oral mucosa is pink  neuro- grossly intact skin- no apparent rash or lesions or cyanosis   ASSESSMENT & PLAN:    1.  OSA - The patient is tolerating PAP therapy well without any problems. The PAP download performed by his DME was personally reviewed and interpreted by me today and showed an AHI of 1.1/hr on 17/13 cm H2O with 100% compliance in using more than 4 hours nightly.  The patient has been using and benefiting from PAP use and will continue to benefit from therapy.   2.  HTN -BP is well  controlled  -continue prescription drug management with Lisinopril 40mg  daily with PRN refills  3.  Obesity -Her BMI is very high still and I talked to her about the importance of her trying to get her weight down -she has lost 5lbs recently and will continue on diet and exercise  COVID-19 Education: The signs and symptoms of COVID-19 were discussed with the patient and how to seek care for testing (follow up with PCP or arrange E-visit).  The importance of social distancing was discussed today.  Patient Risk:   After full review of  this patient's clinical status, I feel that they are at least moderate risk at this time.  Time:   Today, I have spent 15 minutes on telemedicine discussing medical problems including OSA, HTN, Obesity and reviewing patient's chart including PAP compliance download.  Medication Adjustments/Labs and Tests Ordered: Current medicines are reviewed at length with the patient today.  Concerns regarding medicines are outlined above.  Tests Ordered: No orders of the defined types were placed in this encounter.  Medication Changes: No orders of the defined types were placed in this encounter.   Disposition:  Follow up with me in person 1 year  Signed, Fransico Him, MD  05/17/2021 8:14 AM    [place]

## 2021-05-17 ENCOUNTER — Telehealth (INDEPENDENT_AMBULATORY_CARE_PROVIDER_SITE_OTHER): Payer: Medicare HMO | Admitting: Cardiology

## 2021-05-17 ENCOUNTER — Encounter: Payer: Self-pay | Admitting: Cardiology

## 2021-05-17 ENCOUNTER — Other Ambulatory Visit: Payer: Self-pay

## 2021-05-17 VITALS — BP 117/70 | HR 74 | Temp 96.3°F | Ht 62.5 in | Wt 269.0 lb

## 2021-05-17 DIAGNOSIS — I10 Essential (primary) hypertension: Secondary | ICD-10-CM

## 2021-05-17 DIAGNOSIS — G4733 Obstructive sleep apnea (adult) (pediatric): Secondary | ICD-10-CM

## 2021-05-17 NOTE — Patient Instructions (Signed)

## 2021-06-02 DIAGNOSIS — G4733 Obstructive sleep apnea (adult) (pediatric): Secondary | ICD-10-CM | POA: Diagnosis not present

## 2021-06-14 DIAGNOSIS — Z85820 Personal history of malignant melanoma of skin: Secondary | ICD-10-CM | POA: Diagnosis not present

## 2021-06-14 DIAGNOSIS — D2261 Melanocytic nevi of right upper limb, including shoulder: Secondary | ICD-10-CM | POA: Diagnosis not present

## 2021-06-14 DIAGNOSIS — L821 Other seborrheic keratosis: Secondary | ICD-10-CM | POA: Diagnosis not present

## 2021-06-14 DIAGNOSIS — D1801 Hemangioma of skin and subcutaneous tissue: Secondary | ICD-10-CM | POA: Diagnosis not present

## 2021-06-14 DIAGNOSIS — L400 Psoriasis vulgaris: Secondary | ICD-10-CM | POA: Diagnosis not present

## 2021-06-14 DIAGNOSIS — L918 Other hypertrophic disorders of the skin: Secondary | ICD-10-CM | POA: Diagnosis not present

## 2021-06-14 DIAGNOSIS — D2272 Melanocytic nevi of left lower limb, including hip: Secondary | ICD-10-CM | POA: Diagnosis not present

## 2021-06-14 DIAGNOSIS — Z85828 Personal history of other malignant neoplasm of skin: Secondary | ICD-10-CM | POA: Diagnosis not present

## 2021-06-14 DIAGNOSIS — L814 Other melanin hyperpigmentation: Secondary | ICD-10-CM | POA: Diagnosis not present

## 2021-06-15 DIAGNOSIS — G4733 Obstructive sleep apnea (adult) (pediatric): Secondary | ICD-10-CM | POA: Diagnosis not present

## 2021-07-03 DIAGNOSIS — G4733 Obstructive sleep apnea (adult) (pediatric): Secondary | ICD-10-CM | POA: Diagnosis not present

## 2021-07-15 DIAGNOSIS — Z86010 Personal history of colonic polyps: Secondary | ICD-10-CM | POA: Diagnosis not present

## 2021-07-15 DIAGNOSIS — K2270 Barrett's esophagus without dysplasia: Secondary | ICD-10-CM | POA: Diagnosis not present

## 2021-08-03 DIAGNOSIS — G4733 Obstructive sleep apnea (adult) (pediatric): Secondary | ICD-10-CM | POA: Diagnosis not present

## 2021-08-04 DIAGNOSIS — Z780 Asymptomatic menopausal state: Secondary | ICD-10-CM | POA: Diagnosis not present

## 2021-08-04 DIAGNOSIS — Z1231 Encounter for screening mammogram for malignant neoplasm of breast: Secondary | ICD-10-CM | POA: Diagnosis not present

## 2021-08-12 DIAGNOSIS — M25461 Effusion, right knee: Secondary | ICD-10-CM | POA: Diagnosis not present

## 2021-08-12 DIAGNOSIS — I10 Essential (primary) hypertension: Secondary | ICD-10-CM | POA: Diagnosis not present

## 2021-08-19 DIAGNOSIS — M545 Low back pain, unspecified: Secondary | ICD-10-CM | POA: Diagnosis not present

## 2021-08-19 DIAGNOSIS — M25531 Pain in right wrist: Secondary | ICD-10-CM | POA: Diagnosis not present

## 2021-08-19 DIAGNOSIS — M25561 Pain in right knee: Secondary | ICD-10-CM | POA: Diagnosis not present

## 2021-08-31 DIAGNOSIS — G4733 Obstructive sleep apnea (adult) (pediatric): Secondary | ICD-10-CM | POA: Diagnosis not present

## 2021-09-14 DIAGNOSIS — Z86010 Personal history of colonic polyps: Secondary | ICD-10-CM | POA: Diagnosis not present

## 2021-09-14 DIAGNOSIS — K2960 Other gastritis without bleeding: Secondary | ICD-10-CM | POA: Diagnosis not present

## 2021-09-14 DIAGNOSIS — G4733 Obstructive sleep apnea (adult) (pediatric): Secondary | ICD-10-CM | POA: Diagnosis not present

## 2021-09-14 DIAGNOSIS — K5730 Diverticulosis of large intestine without perforation or abscess without bleeding: Secondary | ICD-10-CM | POA: Diagnosis not present

## 2021-09-14 DIAGNOSIS — K219 Gastro-esophageal reflux disease without esophagitis: Secondary | ICD-10-CM | POA: Diagnosis not present

## 2021-09-14 DIAGNOSIS — D123 Benign neoplasm of transverse colon: Secondary | ICD-10-CM | POA: Diagnosis not present

## 2021-09-14 DIAGNOSIS — K2270 Barrett's esophagus without dysplasia: Secondary | ICD-10-CM | POA: Diagnosis not present

## 2021-09-14 DIAGNOSIS — K449 Diaphragmatic hernia without obstruction or gangrene: Secondary | ICD-10-CM | POA: Diagnosis not present

## 2021-09-17 DIAGNOSIS — M25561 Pain in right knee: Secondary | ICD-10-CM | POA: Diagnosis not present

## 2021-09-17 DIAGNOSIS — M545 Low back pain, unspecified: Secondary | ICD-10-CM | POA: Diagnosis not present

## 2021-09-18 DIAGNOSIS — M79661 Pain in right lower leg: Secondary | ICD-10-CM | POA: Diagnosis not present

## 2021-09-18 DIAGNOSIS — M79662 Pain in left lower leg: Secondary | ICD-10-CM | POA: Diagnosis not present

## 2021-09-18 DIAGNOSIS — M79604 Pain in right leg: Secondary | ICD-10-CM | POA: Diagnosis not present

## 2021-09-18 DIAGNOSIS — M79605 Pain in left leg: Secondary | ICD-10-CM | POA: Diagnosis not present

## 2021-09-18 DIAGNOSIS — M25561 Pain in right knee: Secondary | ICD-10-CM | POA: Diagnosis not present

## 2021-09-21 DIAGNOSIS — M25561 Pain in right knee: Secondary | ICD-10-CM | POA: Diagnosis not present

## 2021-09-21 DIAGNOSIS — D123 Benign neoplasm of transverse colon: Secondary | ICD-10-CM | POA: Diagnosis not present

## 2021-09-21 DIAGNOSIS — K219 Gastro-esophageal reflux disease without esophagitis: Secondary | ICD-10-CM | POA: Diagnosis not present

## 2021-09-22 ENCOUNTER — Encounter (HOSPITAL_COMMUNITY): Payer: Medicare HMO

## 2021-09-24 DIAGNOSIS — M25561 Pain in right knee: Secondary | ICD-10-CM | POA: Diagnosis not present

## 2021-10-01 DIAGNOSIS — G4733 Obstructive sleep apnea (adult) (pediatric): Secondary | ICD-10-CM | POA: Diagnosis not present

## 2021-10-31 DIAGNOSIS — G4733 Obstructive sleep apnea (adult) (pediatric): Secondary | ICD-10-CM | POA: Diagnosis not present

## 2021-11-15 ENCOUNTER — Encounter (INDEPENDENT_AMBULATORY_CARE_PROVIDER_SITE_OTHER): Payer: Medicare HMO | Admitting: Ophthalmology

## 2021-12-03 DIAGNOSIS — H548 Legal blindness, as defined in USA: Secondary | ICD-10-CM | POA: Diagnosis not present

## 2021-12-03 DIAGNOSIS — J309 Allergic rhinitis, unspecified: Secondary | ICD-10-CM | POA: Diagnosis not present

## 2021-12-03 DIAGNOSIS — L409 Psoriasis, unspecified: Secondary | ICD-10-CM | POA: Diagnosis not present

## 2021-12-03 DIAGNOSIS — E1169 Type 2 diabetes mellitus with other specified complication: Secondary | ICD-10-CM | POA: Diagnosis not present

## 2021-12-03 DIAGNOSIS — K2270 Barrett's esophagus without dysplasia: Secondary | ICD-10-CM | POA: Diagnosis not present

## 2021-12-03 DIAGNOSIS — E559 Vitamin D deficiency, unspecified: Secondary | ICD-10-CM | POA: Diagnosis not present

## 2021-12-03 DIAGNOSIS — Z7984 Long term (current) use of oral hypoglycemic drugs: Secondary | ICD-10-CM | POA: Diagnosis not present

## 2021-12-03 DIAGNOSIS — G4733 Obstructive sleep apnea (adult) (pediatric): Secondary | ICD-10-CM | POA: Diagnosis not present

## 2021-12-03 DIAGNOSIS — Z6841 Body Mass Index (BMI) 40.0 and over, adult: Secondary | ICD-10-CM | POA: Diagnosis not present

## 2021-12-03 DIAGNOSIS — K219 Gastro-esophageal reflux disease without esophagitis: Secondary | ICD-10-CM | POA: Diagnosis not present

## 2021-12-03 DIAGNOSIS — I10 Essential (primary) hypertension: Secondary | ICD-10-CM | POA: Diagnosis not present

## 2021-12-13 DIAGNOSIS — Z111 Encounter for screening for respiratory tuberculosis: Secondary | ICD-10-CM | POA: Diagnosis not present

## 2021-12-15 DIAGNOSIS — D225 Melanocytic nevi of trunk: Secondary | ICD-10-CM | POA: Diagnosis not present

## 2021-12-15 DIAGNOSIS — Z85828 Personal history of other malignant neoplasm of skin: Secondary | ICD-10-CM | POA: Diagnosis not present

## 2021-12-15 DIAGNOSIS — L570 Actinic keratosis: Secondary | ICD-10-CM | POA: Diagnosis not present

## 2021-12-15 DIAGNOSIS — L400 Psoriasis vulgaris: Secondary | ICD-10-CM | POA: Diagnosis not present

## 2021-12-15 DIAGNOSIS — L814 Other melanin hyperpigmentation: Secondary | ICD-10-CM | POA: Diagnosis not present

## 2021-12-15 DIAGNOSIS — Z85820 Personal history of malignant melanoma of skin: Secondary | ICD-10-CM | POA: Diagnosis not present

## 2021-12-15 DIAGNOSIS — D2272 Melanocytic nevi of left lower limb, including hip: Secondary | ICD-10-CM | POA: Diagnosis not present

## 2021-12-15 DIAGNOSIS — D1801 Hemangioma of skin and subcutaneous tissue: Secondary | ICD-10-CM | POA: Diagnosis not present

## 2021-12-15 DIAGNOSIS — L821 Other seborrheic keratosis: Secondary | ICD-10-CM | POA: Diagnosis not present

## 2021-12-17 DIAGNOSIS — G4733 Obstructive sleep apnea (adult) (pediatric): Secondary | ICD-10-CM | POA: Diagnosis not present

## 2021-12-20 ENCOUNTER — Encounter (INDEPENDENT_AMBULATORY_CARE_PROVIDER_SITE_OTHER): Payer: Medicare HMO | Admitting: Ophthalmology

## 2021-12-20 ENCOUNTER — Ambulatory Visit (INDEPENDENT_AMBULATORY_CARE_PROVIDER_SITE_OTHER): Payer: Medicare HMO | Admitting: Ophthalmology

## 2021-12-20 ENCOUNTER — Encounter (INDEPENDENT_AMBULATORY_CARE_PROVIDER_SITE_OTHER): Payer: Self-pay | Admitting: Ophthalmology

## 2021-12-20 DIAGNOSIS — H2511 Age-related nuclear cataract, right eye: Secondary | ICD-10-CM

## 2021-12-20 DIAGNOSIS — H353134 Nonexudative age-related macular degeneration, bilateral, advanced atrophic with subfoveal involvement: Secondary | ICD-10-CM

## 2021-12-20 DIAGNOSIS — H3553 Other dystrophies primarily involving the sensory retina: Secondary | ICD-10-CM

## 2021-12-20 DIAGNOSIS — H2512 Age-related nuclear cataract, left eye: Secondary | ICD-10-CM | POA: Diagnosis not present

## 2021-12-20 NOTE — Progress Notes (Signed)
12/20/2021     CHIEF COMPLAINT Patient presents for  Chief Complaint  Patient presents with   Retina Follow Up      HISTORY OF PRESENT ILLNESS: Kathryn Pittman is a 66 y.o. female who presents to the clinic today for:   HPI   2 YRS FU OU OCT FP. Pt stated, "it gradually getting worse. Its getting harder to see anything small. I have to use my magnifying glass. I can still see pretty well but it is still blurry in both eyes. The right eye is worse than the left eye." Pt denies new floaters and FOL.  Last edited by Silvestre Moment on 12/20/2021  2:22 PM.      Referring physician: Antony Contras, MD [address],  [place] [address]  HISTORICAL INFORMATION:   Selected notes from the [facility]: No current outpatient medications on file. (Ophthalmic Drugs)   No current facility-administered medications for this visit. (Ophthalmic Drugs)   Current Outpatient Medications (Other)  Medication Sig   Biotin w/ Vitamins C & E (HAIR/SKIN/NAILS PO) Take by mouth.   cetirizine (ZYRTEC) 10 MG tablet Take 10 mg by mouth daily.   fluticasone (FLONASE) 50 MCG/ACT nasal spray Place 2 sprays into both nostrils daily as needed for allergies.    lisinopril (PRINIVIL,ZESTRIL) 40 MG tablet Take 40 mg by mouth daily.   Multiple Vitamins-Minerals (VITEYES AREDS FORMULA/LUTEIN PO) Take 1 tablet by mouth daily.    Nutritional Supplements (IMMUNE ENHANCE PO) Take by mouth.   pantoprazole (PROTONIX) 40 MG tablet Take 40 mg by mouth daily.   SKYRIZI, 150 MG DOSE, 75 MG/0.83ML PSKT as directed. Every 12 weeks   vitamin B-12 (CYANOCOBALAMIN) 500 MCG tablet Take 500 mcg by mouth daily.   No current facility-administered medications for this visit. (Other)      REVIEW OF SYSTEMS: ROS   Negative for: Constitutional, Gastrointestinal, Neurological, Skin, Genitourinary, Musculoskeletal, HENT, Endocrine, Cardiovascular, Eyes, Respiratory, Psychiatric,  Allergic/Imm, Heme/Lymph Last edited by Silvestre Moment on 12/20/2021  2:22 PM.       ALLERGIES Allergies  Allergen Reactions   Excedrin Extra Strength [Aspirin-Acetaminophen-Caffeine] Itching    PAST MEDICAL HISTORY Past Medical History:  Diagnosis Date   Allergic rhinitis    Anemia    hx of low iron   Arthritis    GERD (gastroesophageal reflux disease)    with Barretts esophagus   Heart murmur    "slight"    History of hiatal hernia    has been repaired   History of kidney stones    Hypertension    Legally blind    Macular degeneration, bilateral    Morton's neuroma    Left   Onychomycosis    OSA (obstructive sleep apnea)    on BiPAP 167/13cm H2O   Osteoarthritis, shoulder    right   Psoriasis    TMJ syndrome    Left>right   Past Surgical History:  Procedure Laterality Date   ABDOMINAL HYSTERECTOMY     CHOLECYSTECTOMY     COLONOSCOPY     COLONOSCOPY WITH PROPOFOL N/A 06/24/2016   Procedure: COLONOSCOPY WITH PROPOFOL;  Surgeon: Ronald Lobo, MD;  Location: [facility] ENDOSCOPY;  Service: Endoscopy;  Laterality: N/A;   ESOPHAGOGASTRODUODENOSCOPY (EGD) WITH PROPOFOL N/A 06/24/2016   Procedure: ESOPHAGOGASTRODUODENOSCOPY (EGD) WITH PROPOFOL;  Surgeon: Ronald Lobo, MD;  Location: [facility] ENDOSCOPY;  Service: Endoscopy;  Laterality: N/A;   hiatal hernia surgery  KNEE ARTHROSCOPY      FAMILY HISTORY Family History  Problem Relation Age of Onset   Heart attack Mother     SOCIAL HISTORY Social History   Tobacco Use   Smoking status: Never   Smokeless tobacco: Never  Vaping Use   Vaping Use: Never used  Substance Use Topics   Alcohol use: No    Alcohol/week: 0.0 standard drinks of alcohol   Drug use: No         OPHTHALMIC EXAM:  Base Eye Exam     Visual Acuity (ETDRS)       Right Left   Dist [place] 20/400 20/400   Dist ph [place] NI NI         Tonometry (Tonopen, 2:26 PM)       Right Left   Pressure 15 16         Pupils       Pupils Dark Light Shape  React APD   Right PERRL 6 5 Round Brisk None   Left PERRL 6 5 Round Brisk None         Visual Fields       Left Right   Restrictions  Partial outer superior temporal, inferior temporal, superior nasal, inferior nasal deficiencies         Extraocular Movement       Right Left    Full Full         Neuro/Psych     Oriented x3: Yes   Mood/Affect: Normal         Dilation     Both eyes: 1.0% Mydriacyl, 2.5% Phenylephrine @ 2:26 PM           Slit Lamp and Fundus Exam     External Exam       Right Left   External Normal Normal         Slit Lamp Exam       Right Left   Lids/Lashes Normal Normal   Conjunctiva/Sclera White and quiet White and quiet   Cornea Clear Clear   Anterior Chamber Deep and quiet Deep and quiet   Iris Round and reactive Round and reactive   Lens 2+ Nuclear sclerosis, 2+ Cortical cataract 2+ Nuclear sclerosis, 2+ Cortical cataract   Anterior Vitreous Normal Normal         Fundus Exam       Right Left   Posterior Vitreous Posterior vitreous detachment Posterior vitreous detachment   Disc Normal Normal   C/D Ratio 0.45 0.45   Macula Geographic atrophy Geographic atrophy   Vessels Normal, no DR Normal, no DR   Periphery Pigmentation, Abnormal pigmentation, Reticular degeneration Pigmentation, Abnormal pigmentation, Reticular degeneration            IMAGING AND PROCEDURES  Imaging and Procedures for 12/20/21  OCT, Retina - OU - Both Eyes       Right Eye Central Foveal Thickness: 355. Progression has been stable. Findings include central retinal atrophy, inner retinal atrophy, outer retinal atrophy.   Left Eye Central Foveal Thickness: 280. Progression has been stable. Findings include central retinal atrophy, inner retinal atrophy, outer retinal atrophy.   Notes Diffuse atrophy of geographic atrophy of each eye.  No sign of CNVM active      Color Fundus Photography Optos - OU - Both Eyes       Right  Eye Progression has been stable. Disc findings include normal observations. Macula : geographic atrophy, retinal pigment epithelium abnormalities. Periphery : degeneration.  Left Eye Disc findings include normal observations. Macula : geographic atrophy, retinal pigment epithelium abnormalities. Periphery : degeneration.   Notes Geographic macular macular atrophy including the entire clinicians macula as well as extension superior to the nerve and nasal to the nerve.  Similar findings OU.              ASSESSMENT/PLAN:  Advanced nonexudative age-related macular degeneration of both eyes with subfoveal involvement Extensive geographic atrophy OU in the macula encompassing the entire clinicians macula  Nuclear sclerotic cataract of left eye Mild OS  Nuclear sclerotic cataract of right eye Mild OD     ICD-10-CM   1. Stargardt's disease  H35.53 OCT, Retina - OU - Both Eyes    Color Fundus Photography Optos - OU - Both Eyes    2. Advanced nonexudative age-related macular degeneration of both eyes with subfoveal involvement  H35.3134     3. Nuclear sclerotic cataract of left eye  H25.12     4. Nuclear sclerotic cataract of right eye  H25.11       1.  Bilateral central geographic atrophy extensive no interval change over the last 2 years.  We will continue to monitor and observe  2.  Bilateral cataracts, not visually significant given the underlying degree of geographic atrophy  3.  Ophthalmic Meds Ordered this visit:  No orders of the defined types were placed in this encounter.      Return in about 2 years (around 12/21/2023) for COLOR FP, DILATE OU, OCT.  There are no Patient Instructions on file for this visit.   Explained the diagnoses, plan, and follow up with the patient and they expressed understanding.  Patient expressed understanding of the importance of proper follow up care.   Clent Demark Tishawn Friedhoff M.D. Diseases & Surgery of the Retina and Vitreous Retina &  Diabetic [place] 12/20/21     Abbreviations: M myopia (nearsighted); A astigmatism; H hyperopia (farsighted); P presbyopia; Mrx spectacle prescription;  CTL contact lenses; OD right eye; OS left eye; OU both eyes  XT exotropia; ET esotropia; PEK punctate epithelial keratitis; PEE punctate epithelial erosions; DES dry eye syndrome; MGD meibomian gland dysfunction; ATs artificial tears; PFAT's preservative free artificial tears; [place] nuclear sclerotic cataract; PSC posterior subcapsular cataract; ERM epi-retinal membrane; PVD posterior vitreous detachment; RD retinal detachment; DM diabetes mellitus; DR diabetic retinopathy; NPDR non-proliferative diabetic retinopathy; PDR proliferative diabetic retinopathy; CSME clinically significant macular edema; DME diabetic macular edema; dbh dot blot hemorrhages; CWS cotton wool spot; POAG primary open angle glaucoma; C/D cup-to-disc ratio; HVF humphrey visual field; GVF goldmann visual field; OCT optical coherence tomography; IOP intraocular pressure; BRVO Branch retinal vein occlusion; CRVO central retinal vein occlusion; CRAO central retinal artery occlusion; BRAO branch retinal artery occlusion; RT retinal tear; SB scleral buckle; PPV pars plana vitrectomy; VH Vitreous hemorrhage; PRP panretinal laser photocoagulation; IVK intravitreal kenalog; VMT vitreomacular traction; MH Macular hole;  NVD neovascularization of the disc; NVE neovascularization elsewhere; AREDS age related eye disease study; ARMD age related macular degeneration; POAG primary open angle glaucoma; EBMD epithelial/anterior basement membrane dystrophy; ACIOL anterior chamber intraocular lens; IOL intraocular lens; PCIOL posterior chamber intraocular lens; Phaco/IOL phacoemulsification with intraocular lens placement; [place] photorefractive keratectomy; LASIK laser assisted in situ keratomileusis; HTN hypertension; DM diabetes mellitus; COPD chronic obstructive pulmonary disease

## 2021-12-20 NOTE — Assessment & Plan Note (Signed)
Mild OD

## 2021-12-20 NOTE — Assessment & Plan Note (Signed)
Extensive geographic atrophy OU in the macula encompassing the entire clinicians macula

## 2021-12-20 NOTE — Assessment & Plan Note (Signed)
Mild OS

## 2022-01-11 DIAGNOSIS — Z85820 Personal history of malignant melanoma of skin: Secondary | ICD-10-CM | POA: Diagnosis not present

## 2022-01-11 DIAGNOSIS — L570 Actinic keratosis: Secondary | ICD-10-CM | POA: Diagnosis not present

## 2022-01-11 DIAGNOSIS — Z85828 Personal history of other malignant neoplasm of skin: Secondary | ICD-10-CM | POA: Diagnosis not present

## 2022-01-11 DIAGNOSIS — L853 Xerosis cutis: Secondary | ICD-10-CM | POA: Diagnosis not present

## 2022-03-08 DIAGNOSIS — B078 Other viral warts: Secondary | ICD-10-CM | POA: Diagnosis not present

## 2022-03-08 DIAGNOSIS — D485 Neoplasm of uncertain behavior of skin: Secondary | ICD-10-CM | POA: Diagnosis not present

## 2022-03-08 DIAGNOSIS — Z85820 Personal history of malignant melanoma of skin: Secondary | ICD-10-CM | POA: Diagnosis not present

## 2022-03-22 DIAGNOSIS — G4733 Obstructive sleep apnea (adult) (pediatric): Secondary | ICD-10-CM | POA: Diagnosis not present

## 2022-04-02 DIAGNOSIS — M1990 Unspecified osteoarthritis, unspecified site: Secondary | ICD-10-CM | POA: Diagnosis not present

## 2022-04-02 DIAGNOSIS — E119 Type 2 diabetes mellitus without complications: Secondary | ICD-10-CM | POA: Diagnosis not present

## 2022-04-02 DIAGNOSIS — B91 Sequelae of poliomyelitis: Secondary | ICD-10-CM | POA: Diagnosis not present

## 2022-04-02 DIAGNOSIS — K2270 Barrett's esophagus without dysplasia: Secondary | ICD-10-CM | POA: Diagnosis not present

## 2022-04-02 DIAGNOSIS — L400 Psoriasis vulgaris: Secondary | ICD-10-CM | POA: Diagnosis not present

## 2022-04-02 DIAGNOSIS — Z6841 Body Mass Index (BMI) 40.0 and over, adult: Secondary | ICD-10-CM | POA: Diagnosis not present

## 2022-04-02 DIAGNOSIS — K219 Gastro-esophageal reflux disease without esophagitis: Secondary | ICD-10-CM | POA: Diagnosis not present

## 2022-04-02 DIAGNOSIS — E785 Hyperlipidemia, unspecified: Secondary | ICD-10-CM | POA: Diagnosis not present

## 2022-04-02 DIAGNOSIS — G4733 Obstructive sleep apnea (adult) (pediatric): Secondary | ICD-10-CM | POA: Diagnosis not present

## 2022-04-02 DIAGNOSIS — I10 Essential (primary) hypertension: Secondary | ICD-10-CM | POA: Diagnosis not present

## 2022-04-02 DIAGNOSIS — M8960 Osteopathy after poliomyelitis, unspecified site: Secondary | ICD-10-CM | POA: Diagnosis not present

## 2022-05-10 DIAGNOSIS — K219 Gastro-esophageal reflux disease without esophagitis: Secondary | ICD-10-CM | POA: Diagnosis not present

## 2022-05-10 DIAGNOSIS — E559 Vitamin D deficiency, unspecified: Secondary | ICD-10-CM | POA: Diagnosis not present

## 2022-05-10 DIAGNOSIS — I10 Essential (primary) hypertension: Secondary | ICD-10-CM | POA: Diagnosis not present

## 2022-05-10 DIAGNOSIS — R011 Cardiac murmur, unspecified: Secondary | ICD-10-CM | POA: Diagnosis not present

## 2022-05-10 DIAGNOSIS — M25561 Pain in right knee: Secondary | ICD-10-CM | POA: Diagnosis not present

## 2022-05-10 DIAGNOSIS — L409 Psoriasis, unspecified: Secondary | ICD-10-CM | POA: Diagnosis not present

## 2022-05-10 DIAGNOSIS — K2270 Barrett's esophagus without dysplasia: Secondary | ICD-10-CM | POA: Diagnosis not present

## 2022-05-10 DIAGNOSIS — E1169 Type 2 diabetes mellitus with other specified complication: Secondary | ICD-10-CM | POA: Diagnosis not present

## 2022-05-10 DIAGNOSIS — Z85820 Personal history of malignant melanoma of skin: Secondary | ICD-10-CM | POA: Diagnosis not present

## 2022-05-10 DIAGNOSIS — G4733 Obstructive sleep apnea (adult) (pediatric): Secondary | ICD-10-CM | POA: Diagnosis not present

## 2022-05-10 DIAGNOSIS — H548 Legal blindness, as defined in USA: Secondary | ICD-10-CM | POA: Diagnosis not present

## 2022-05-10 DIAGNOSIS — Z0000 Encounter for general adult medical examination without abnormal findings: Secondary | ICD-10-CM | POA: Diagnosis not present

## 2022-05-23 ENCOUNTER — Encounter: Payer: Self-pay | Admitting: Cardiology

## 2022-05-23 ENCOUNTER — Ambulatory Visit: Payer: Medicare HMO | Attending: Cardiology | Admitting: Cardiology

## 2022-05-23 VITALS — BP 118/70 | HR 86 | Ht 62.0 in | Wt 261.0 lb

## 2022-05-23 DIAGNOSIS — I10 Essential (primary) hypertension: Secondary | ICD-10-CM | POA: Diagnosis not present

## 2022-05-23 DIAGNOSIS — G4733 Obstructive sleep apnea (adult) (pediatric): Secondary | ICD-10-CM | POA: Diagnosis not present

## 2022-05-23 NOTE — Patient Instructions (Signed)
Medication Instructions:  Your physician recommends that you continue on your current medications as directed. Please refer to the Current Medication list given to you today.  *If you need a refill on your cardiac medications before your next appointment, please call your pharmacy*   Follow-Up: At [facility], you and your health needs are our priority.  As part of our continuing mission to provide you with exceptional heart care, we have created designated Provider Care Teams.  These Care Teams include your primary Cardiologist (physician) and Advanced Practice Providers (APPs -  Physician Assistants and Nurse Practitioners) who all work together to provide you with the care you need, when you need it.  We recommend signing up for the patient portal called "MyChart".  Sign up information is provided on this After Visit Summary.  MyChart is used to connect with patients for Virtual Visits (Telemedicine).  Patients are able to view lab/test results, encounter notes, upcoming appointments, etc.  Non-urgent messages can be sent to your provider as well.   To learn more about what you can do with MyChart, go to [email or web address].    Your next appointment:   1 year(s)  The format for your next appointment:   In Person  Provider:   Dr. Radford Pax   Important Information About Sugar

## 2022-05-23 NOTE — Progress Notes (Signed)
Date:  05/23/2022   ID:  Kathryn Pittman, DOB 04/24/1956, MRN [id number]  PCP:  Antony Contras, MD  Sleep Medicine:  Fransico Him, MD Electrophysiologist:  None   Chief Complaint:  OSA, morbid obesity, HTN  History of Present Illness:    Kathryn Pittman is a 66 y.o. female with a hx of severe OSA on BiPAP, HTN and obesity.  She is doing well with his PAP device and thinks that she has gotten used to it.  She tolerates the mask and feels the pressure is adequate.  Since going on PAP she feels rested in the am and has no significant daytime sleepiness.  She denies any significant mouth or nasal dryness or nasal congestion.  She does not think that he snores.    Prior CV studies:   The following studies were reviewed today:  PAP compliance download  Past Medical History:  Diagnosis Date   Allergic rhinitis    Anemia    hx of low iron   Arthritis    GERD (gastroesophageal reflux disease)    with Barretts esophagus   Heart murmur    "slight"    History of hiatal hernia    has been repaired   History of kidney stones    Hypertension    Legally blind    Macular degeneration, bilateral    Morton's neuroma    Left   Onychomycosis    OSA (obstructive sleep apnea)    on BiPAP 167/13cm H2O   Osteoarthritis, shoulder    right   Psoriasis    TMJ syndrome    Left>right   Past Surgical History:  Procedure Laterality Date   ABDOMINAL HYSTERECTOMY     CHOLECYSTECTOMY     COLONOSCOPY     COLONOSCOPY WITH PROPOFOL N/A 06/24/2016   Procedure: COLONOSCOPY WITH PROPOFOL;  Surgeon: Ronald Lobo, MD;  Location: [facility] ENDOSCOPY;  Service: Endoscopy;  Laterality: N/A;   ESOPHAGOGASTRODUODENOSCOPY (EGD) WITH PROPOFOL N/A 06/24/2016   Procedure: ESOPHAGOGASTRODUODENOSCOPY (EGD) WITH PROPOFOL;  Surgeon: Ronald Lobo, MD;  Location: [facility] ENDOSCOPY;  Service: Endoscopy;  Laterality: N/A;   hiatal hernia surgery     KNEE ARTHROSCOPY       Current Meds  Medication Sig   Biotin w/ Vitamins C  & E (HAIR/SKIN/NAILS PO) Take by mouth.   cetirizine (ZYRTEC) 10 MG tablet Take 10 mg by mouth daily.   fluticasone (FLONASE) 50 MCG/ACT nasal spray Place 2 sprays into both nostrils daily as needed for allergies.    lisinopril (PRINIVIL,ZESTRIL) 40 MG tablet Take 40 mg by mouth daily.   Multiple Vitamins-Minerals (VITEYES AREDS FORMULA/LUTEIN PO) Take 1 tablet by mouth daily.    Nutritional Supplements (IMMUNE ENHANCE PO) Take by mouth.   pantoprazole (PROTONIX) 40 MG tablet Take 40 mg by mouth daily.   rosuvastatin (CRESTOR) 5 MG tablet Take 5 mg by mouth once a week.   SKYRIZI, 150 MG DOSE, 75 MG/0.83ML PSKT as directed. Every 12 weeks   vitamin B-12 (CYANOCOBALAMIN) 500 MCG tablet Take 500 mcg by mouth daily.     Allergies:   Excedrin extra strength [aspirin-acetaminophen-caffeine]   Social History   Tobacco Use   Smoking status: Never   Smokeless tobacco: Never  Vaping Use   Vaping Use: Never used  Substance Use Topics   Alcohol use: No    Alcohol/week: 0.0 standard drinks of alcohol   Drug use: No     Family Hx: The patient's family history includes Heart attack in her mother.  ROS:   Please see the history of present illness.     All other systems reviewed and are negative.   Labs/Other Tests and Data Reviewed:    Recent Labs: No results found for requested labs within last 365 days.   Recent Lipid Panel No results found for: "CHOL", "TRIG", "HDL", "CHOLHDL", "LDLCALC", "LDLDIRECT"  Wt Readings from Last 3 Encounters:  05/23/22 261 lb (118.4 kg)  05/17/21 269 lb (122 kg)  11/11/20 277 lb (125.6 kg)     Objective:    Vital Signs:  BP 118/70   Pulse 86   Ht '5\' 2"'$  (1.575 m)   Wt 261 lb (118.4 kg)   SpO2 97%   BMI 47.74 kg/m   GEN: Well nourished, well developed in no acute distress HEENT: Normal NECK: No JVD; No carotid bruits LYMPHATICS: No lymphadenopathy CARDIAC:RRR, no murmurs, rubs, gallops RESPIRATORY:  Clear to auscultation without rales,  wheezing or rhonchi  ABDOMEN: Soft, non-tender, non-distended MUSCULOSKELETAL:  No edema; No deformity  SKIN: Warm and dry NEUROLOGIC:  Alert and oriented x 3 PSYCHIATRIC:  Normal affect ASSESSMENT & PLAN:    1. OSA - The patient is tolerating PAP therapy well without any problems. The PAP download performed by his DME was personally reviewed and interpreted by me today and showed an AHI of 0.9 /hr on auto BiPAP on 17/13 cm H2O with 100% compliance in using more than 4 hours nightly.  The patient has been using and benefiting from PAP use and will continue to benefit from therapy.   2.  HTN -BP is adequately on exam today -Continue prescription drug with lisinopril 40 mg daily with.  Refills  IMedication Adjustments/Labs and Tests Ordered: Current medicines are reviewed at length with the patient today.  Concerns regarding medicines are outlined above.  Tests Ordered: No orders of the defined types were placed in this encounter.   Medication Changes: No orders of the defined types were placed in this encounter.    Disposition:  Follow up with me in person 1 year  Signed, Fransico Him, MD  05/23/2022 11:19 AM    [place]

## 2022-06-22 DIAGNOSIS — G4733 Obstructive sleep apnea (adult) (pediatric): Secondary | ICD-10-CM | POA: Diagnosis not present

## 2022-08-10 DIAGNOSIS — Z1231 Encounter for screening mammogram for malignant neoplasm of breast: Secondary | ICD-10-CM | POA: Diagnosis not present

## 2022-08-24 ENCOUNTER — Ambulatory Visit: Payer: Medicare HMO | Admitting: Cardiology

## 2022-08-28 DIAGNOSIS — R509 Fever, unspecified: Secondary | ICD-10-CM | POA: Diagnosis not present

## 2022-08-28 DIAGNOSIS — R5383 Other fatigue: Secondary | ICD-10-CM | POA: Diagnosis not present

## 2022-08-28 DIAGNOSIS — R059 Cough, unspecified: Secondary | ICD-10-CM | POA: Diagnosis not present

## 2022-08-28 DIAGNOSIS — U071 COVID-19: Secondary | ICD-10-CM | POA: Diagnosis not present

## 2022-08-28 DIAGNOSIS — R0981 Nasal congestion: Secondary | ICD-10-CM | POA: Diagnosis not present

## 2022-10-14 DIAGNOSIS — G4733 Obstructive sleep apnea (adult) (pediatric): Secondary | ICD-10-CM | POA: Diagnosis not present

## 2022-10-24 ENCOUNTER — Other Ambulatory Visit: Payer: Self-pay | Admitting: Student

## 2022-12-06 DIAGNOSIS — R011 Cardiac murmur, unspecified: Secondary | ICD-10-CM | POA: Diagnosis not present

## 2022-12-06 DIAGNOSIS — N3941 Urge incontinence: Secondary | ICD-10-CM | POA: Diagnosis not present

## 2022-12-06 DIAGNOSIS — L409 Psoriasis, unspecified: Secondary | ICD-10-CM | POA: Diagnosis not present

## 2022-12-06 DIAGNOSIS — Z85820 Personal history of malignant melanoma of skin: Secondary | ICD-10-CM | POA: Diagnosis not present

## 2022-12-06 DIAGNOSIS — E1169 Type 2 diabetes mellitus with other specified complication: Secondary | ICD-10-CM | POA: Diagnosis not present

## 2022-12-06 DIAGNOSIS — K2270 Barrett's esophagus without dysplasia: Secondary | ICD-10-CM | POA: Diagnosis not present

## 2022-12-06 DIAGNOSIS — G4733 Obstructive sleep apnea (adult) (pediatric): Secondary | ICD-10-CM | POA: Diagnosis not present

## 2022-12-06 DIAGNOSIS — I10 Essential (primary) hypertension: Secondary | ICD-10-CM | POA: Diagnosis not present

## 2022-12-06 DIAGNOSIS — K219 Gastro-esophageal reflux disease without esophagitis: Secondary | ICD-10-CM | POA: Diagnosis not present

## 2022-12-06 DIAGNOSIS — J309 Allergic rhinitis, unspecified: Secondary | ICD-10-CM | POA: Diagnosis not present

## 2022-12-06 DIAGNOSIS — H548 Legal blindness, as defined in USA: Secondary | ICD-10-CM | POA: Diagnosis not present

## 2022-12-06 DIAGNOSIS — E559 Vitamin D deficiency, unspecified: Secondary | ICD-10-CM | POA: Diagnosis not present

## 2022-12-27 ENCOUNTER — Encounter (INDEPENDENT_AMBULATORY_CARE_PROVIDER_SITE_OTHER): Payer: Medicare HMO | Admitting: Ophthalmology

## 2023-01-11 DIAGNOSIS — R351 Nocturia: Secondary | ICD-10-CM | POA: Diagnosis not present

## 2023-01-11 DIAGNOSIS — N3946 Mixed incontinence: Secondary | ICD-10-CM | POA: Diagnosis not present

## 2023-01-11 DIAGNOSIS — R8271 Bacteriuria: Secondary | ICD-10-CM | POA: Diagnosis not present

## 2023-01-17 DIAGNOSIS — K219 Gastro-esophageal reflux disease without esophagitis: Secondary | ICD-10-CM | POA: Diagnosis not present

## 2023-01-17 DIAGNOSIS — L409 Psoriasis, unspecified: Secondary | ICD-10-CM | POA: Diagnosis not present

## 2023-01-17 DIAGNOSIS — R053 Chronic cough: Secondary | ICD-10-CM | POA: Diagnosis not present

## 2023-01-17 DIAGNOSIS — D849 Immunodeficiency, unspecified: Secondary | ICD-10-CM | POA: Diagnosis not present

## 2023-01-17 DIAGNOSIS — J309 Allergic rhinitis, unspecified: Secondary | ICD-10-CM | POA: Diagnosis not present

## 2023-01-17 DIAGNOSIS — I10 Essential (primary) hypertension: Secondary | ICD-10-CM | POA: Diagnosis not present

## 2023-01-17 DIAGNOSIS — J22 Unspecified acute lower respiratory infection: Secondary | ICD-10-CM | POA: Diagnosis not present

## 2023-01-17 DIAGNOSIS — E119 Type 2 diabetes mellitus without complications: Secondary | ICD-10-CM | POA: Diagnosis not present

## 2023-02-03 DIAGNOSIS — J189 Pneumonia, unspecified organism: Secondary | ICD-10-CM | POA: Diagnosis not present

## 2023-02-03 DIAGNOSIS — R051 Acute cough: Secondary | ICD-10-CM | POA: Diagnosis not present

## 2023-02-09 DIAGNOSIS — G4733 Obstructive sleep apnea (adult) (pediatric): Secondary | ICD-10-CM | POA: Diagnosis not present

## 2023-02-14 DIAGNOSIS — Z111 Encounter for screening for respiratory tuberculosis: Secondary | ICD-10-CM | POA: Diagnosis not present

## 2023-02-16 DIAGNOSIS — Z85820 Personal history of malignant melanoma of skin: Secondary | ICD-10-CM | POA: Diagnosis not present

## 2023-02-16 DIAGNOSIS — L400 Psoriasis vulgaris: Secondary | ICD-10-CM | POA: Diagnosis not present

## 2023-02-16 DIAGNOSIS — L570 Actinic keratosis: Secondary | ICD-10-CM | POA: Diagnosis not present

## 2023-02-16 DIAGNOSIS — Z79899 Other long term (current) drug therapy: Secondary | ICD-10-CM | POA: Diagnosis not present

## 2023-02-16 DIAGNOSIS — D1801 Hemangioma of skin and subcutaneous tissue: Secondary | ICD-10-CM | POA: Diagnosis not present

## 2023-02-16 DIAGNOSIS — D2272 Melanocytic nevi of left lower limb, including hip: Secondary | ICD-10-CM | POA: Diagnosis not present

## 2023-02-16 DIAGNOSIS — Z85828 Personal history of other malignant neoplasm of skin: Secondary | ICD-10-CM | POA: Diagnosis not present

## 2023-02-16 DIAGNOSIS — D2271 Melanocytic nevi of right lower limb, including hip: Secondary | ICD-10-CM | POA: Diagnosis not present

## 2023-02-16 DIAGNOSIS — L821 Other seborrheic keratosis: Secondary | ICD-10-CM | POA: Diagnosis not present

## 2023-03-09 DIAGNOSIS — J209 Acute bronchitis, unspecified: Secondary | ICD-10-CM | POA: Diagnosis not present

## 2023-03-15 DIAGNOSIS — E119 Type 2 diabetes mellitus without complications: Secondary | ICD-10-CM | POA: Diagnosis not present

## 2023-03-15 DIAGNOSIS — J42 Unspecified chronic bronchitis: Secondary | ICD-10-CM | POA: Diagnosis not present

## 2023-03-21 ENCOUNTER — Other Ambulatory Visit: Payer: Self-pay | Admitting: Family Medicine

## 2023-03-21 ENCOUNTER — Encounter: Payer: Self-pay | Admitting: Family Medicine

## 2023-03-21 DIAGNOSIS — J449 Chronic obstructive pulmonary disease, unspecified: Secondary | ICD-10-CM | POA: Diagnosis not present

## 2023-03-21 DIAGNOSIS — K219 Gastro-esophageal reflux disease without esophagitis: Secondary | ICD-10-CM | POA: Diagnosis not present

## 2023-03-21 DIAGNOSIS — I10 Essential (primary) hypertension: Secondary | ICD-10-CM | POA: Diagnosis not present

## 2023-03-21 DIAGNOSIS — R053 Chronic cough: Secondary | ICD-10-CM | POA: Diagnosis not present

## 2023-03-22 ENCOUNTER — Ambulatory Visit
Admission: RE | Admit: 2023-03-22 | Discharge: 2023-03-22 | Disposition: A | Discharge status: Home / Self Care | Payer: Medicare HMO | Source: Ambulatory Visit | Attending: Family Medicine | Admitting: Family Medicine

## 2023-03-22 DIAGNOSIS — K449 Diaphragmatic hernia without obstruction or gangrene: Secondary | ICD-10-CM | POA: Diagnosis not present

## 2023-03-22 DIAGNOSIS — R053 Chronic cough: Secondary | ICD-10-CM

## 2023-03-22 DIAGNOSIS — I2510 Atherosclerotic heart disease of native coronary artery without angina pectoris: Secondary | ICD-10-CM | POA: Diagnosis not present

## 2023-03-23 ENCOUNTER — Other Ambulatory Visit: Payer: Self-pay | Admitting: Family Medicine

## 2023-03-23 ENCOUNTER — Encounter: Payer: Self-pay | Admitting: Family Medicine

## 2023-03-23 DIAGNOSIS — N2889 Other specified disorders of kidney and ureter: Secondary | ICD-10-CM

## 2023-05-04 ENCOUNTER — Encounter: Payer: Self-pay | Admitting: Family Medicine

## 2023-05-06 ENCOUNTER — Ambulatory Visit
Admission: RE | Admit: 2023-05-06 | Discharge: 2023-05-06 | Disposition: A | Discharge status: Home / Self Care | Payer: Medicare HMO | Source: Ambulatory Visit | Attending: Family Medicine | Admitting: Family Medicine

## 2023-05-06 DIAGNOSIS — N2889 Other specified disorders of kidney and ureter: Secondary | ICD-10-CM | POA: Diagnosis not present

## 2023-05-06 DIAGNOSIS — Z9049 Acquired absence of other specified parts of digestive tract: Secondary | ICD-10-CM | POA: Diagnosis not present

## 2023-05-06 DIAGNOSIS — R160 Hepatomegaly, not elsewhere classified: Secondary | ICD-10-CM | POA: Diagnosis not present

## 2023-05-06 MED ORDER — GADOPICLENOL 0.5 MMOL/ML IV SOLN
10.0000 mL | Freq: Once | INTRAVENOUS | Status: AC | PRN
  Administered 2023-05-06: 10 mL via INTRAVENOUS

## 2023-06-09 DIAGNOSIS — G4733 Obstructive sleep apnea (adult) (pediatric): Secondary | ICD-10-CM | POA: Diagnosis not present

## 2023-06-22 DIAGNOSIS — M25512 Pain in left shoulder: Secondary | ICD-10-CM | POA: Diagnosis not present

## 2023-07-19 ENCOUNTER — Ambulatory Visit (INDEPENDENT_AMBULATORY_CARE_PROVIDER_SITE_OTHER): Payer: Self-pay | Admitting: Pulmonary Disease

## 2023-07-19 VITALS — BP 122/66 | HR 94 | Temp 97.8°F | Ht 62.0 in | Wt 246.4 lb

## 2023-07-19 DIAGNOSIS — G4733 Obstructive sleep apnea (adult) (pediatric): Secondary | ICD-10-CM

## 2023-07-19 DIAGNOSIS — J40 Bronchitis, not specified as acute or chronic: Secondary | ICD-10-CM

## 2023-07-19 NOTE — Patient Instructions (Signed)
 I do not believe we need to make any changes at present  You do not need to continue taking Trelegy  If recurrence of symptoms, can consider a breathing study  I will see you only as needed  If you are around any environment/substances that trigger your allergies-try and avoid as best as possible or make sure you are wearing a mask  Call us  with concerns

## 2023-07-19 NOTE — Progress Notes (Signed)
 Kathryn Pittman    [id number]    [birth year]  Primary Care Physician:Swayne, Myrtie Atkinson, MD  Referring Physician: Rae Bugler, MD [phone] [place],  [place] [address]  Chief complaint:   Recent protracted bronchitis  HPI:  Patient did have an upper respite tract infection about April May Sinus congestion, cough This persisted for We will With recurrence of symptoms in June she used a course of Z-Pak Appeared to improve for a little while and then recurrence croupy cough  She went to urgent care where she was given a course of antibiotics and steroids  Symptoms appear to improve a little better and then recurred She made another urgent care visit and was given a different course of antibiotic and instructed on Trelegy  Has not been using Trelegy on a regular basis since symptoms of significantly improved  In the last couple of weeks she did have some congestion for which to use Mucinex which seemed to help  Has a history of reflux, does not feel this is contributing to symptoms Did have surgery for large hiatal hernia previously  Chest x-ray at some point that showed pneumonia, a more recent CT scan of the chest does not show any significant infiltrate  Never smoker  Did not have a history of asthma  She does have obstructive sleep apnea for which he uses CPAP on a regular basis, compliant, benefiting from CPAP use  She worked in a daycare, no other occupational predisposition to lung disease No obvious previous presence of lung disease  She does have macular degeneration   Outpatient Encounter Medications as of 07/19/2023  Medication Sig   Biotin w/ Vitamins C & E (HAIR/SKIN/NAILS PO) Take by mouth.   cetirizine (ZYRTEC) 10 MG tablet Take 10 mg by mouth daily.   fluticasone (FLONASE) 50 MCG/ACT nasal spray Place 2 sprays into both nostrils daily as needed for allergies.    lisinopril (PRINIVIL,ZESTRIL) 40 MG tablet Take 40 mg by mouth daily.    Multiple Vitamins-Minerals (VITEYES AREDS FORMULA/LUTEIN PO) Take 1 tablet by mouth daily.    Nutritional Supplements (IMMUNE ENHANCE PO) Take by mouth.   pantoprazole (PROTONIX) 40 MG tablet Take 40 mg by mouth daily.   rosuvastatin (CRESTOR) 5 MG tablet Take 5 mg by mouth once a week.   SKYRIZI, 150 MG DOSE, 75 MG/0.83ML PSKT as directed. Every 12 weeks   vitamin B-12 (CYANOCOBALAMIN) 500 MCG tablet Take 500 mcg by mouth daily.   No facility-administered encounter medications on file as of 07/19/2023.    Allergies as of 07/19/2023 - Review Complete 05/23/2022  Allergen Reaction Noted   Excedrin extra strength [aspirin-acetaminophen-caffeine] Itching 06/24/2013    Past Medical History:  Diagnosis Date   Allergic rhinitis    Anemia    hx of low iron   Arthritis    GERD (gastroesophageal reflux disease)    with Barretts esophagus   Heart murmur    "slight"    History of hiatal hernia    has been repaired   History of kidney stones    Hypertension    Legally blind    Macular degeneration, bilateral    Morton's neuroma    Left   Onychomycosis    OSA (obstructive sleep apnea)    on BiPAP 167/13cm H2O   Osteoarthritis, shoulder    right   Psoriasis    TMJ syndrome    Left>right    Past Surgical History:  Procedure  Laterality Date   ABDOMINAL HYSTERECTOMY     CHOLECYSTECTOMY     COLONOSCOPY     COLONOSCOPY WITH PROPOFOL  N/A 06/24/2016   Procedure: COLONOSCOPY WITH PROPOFOL ;  Surgeon: Lanita Pitman, MD;  Location[facility] ENDOSCOPY;  Service: Endoscopy;  Laterality: N/A;   ESOPHAGOGASTRODUODENOSCOPY (EGD) WITH PROPOFOL  N/A 06/24/2016   Procedure: ESOPHAGOGASTRODUODENOSCOPY (EGD) WITH PROPOFOL ;  Surgeon: Lanita Pitman, MD;  Location: [facility] ENDOSCOPY;  Service: Endoscopy;  Laterality: N/A;   hiatal hernia surgery     KNEE ARTHROSCOPY      Family History  Problem Relation Age of Onset   Heart attack Mother     Social History   Socioeconomic History   Marital status:  Married    Spouse name: Not on file   Number of children: Not on file   Years of education: Not on file   Highest education level: Not on file  Occupational History   Not on file  Tobacco Use   Smoking status: Never   Smokeless tobacco: Never  Vaping Use   Vaping status: Never Used  Substance and Sexual Activity   Alcohol use: No    Alcohol/week: 0.0 standard drinks of alcohol   Drug use: No   Sexual activity: Not on file  Other Topics Concern   Not on file  Social History Narrative   Not on file   Social Drivers of Health   Financial Resource Strain: Not on file  Food Insecurity: Not on file  Transportation Needs: Not on file  Physical Activity: Not on file  Stress: Not on file  Social Connections: Unknown (11/16/2021)   [facility]   Social Network    Social Network: Not on file  Intimate Partner Violence: Unknown (10/08/2021)   Received from [facility], [facility]   HITS    Physically Hurt: Not on file    Insult or Talk Down To: Not on file    Threaten Physical Harm: Not on file    Scream or Curse: Not on file    Review of Systems  Respiratory:  Positive for apnea. Negative for shortness of breath.     There were no vitals filed for this visit.   Physical Exam Constitutional:      Appearance: She is obese.  HENT:     Head: Normocephalic.     Mouth/Throat:     Mouth: Mucous membranes are moist.  Eyes:     General: No scleral icterus. Cardiovascular:     Rate and Rhythm: Normal rate and regular rhythm.     Heart sounds: No murmur heard.    No friction rub.  Pulmonary:     Effort: No respiratory distress.     Breath sounds: No stridor. No wheezing or rhonchi.  Musculoskeletal:     Cervical back: No rigidity or tenderness.  Neurological:     Mental Status: She is alert.  Psychiatric:        Mood and Affect: Mood normal.      Data Reviewed: Recent CT from September showing no infiltrate  Assessment:   Bronchitis -Symptoms are significantly improved  Obstructive sleep apnea -Continues to use CPAP and benefits from CPAP on a regular basis  History of macular degeneration  Class III obesity  Plan/Recommendations:  I do not believe she needs to continue Trelegy at present  No inhalers indicated at present  May have some underlying allergy-avoid known triggers, wear a mask when around dust or known triggers  May consider pulmonary function  test if recurrence of symptoms  Encouraged to stay active, she does exercise regularly already  Continue CPAP use  Call us  with significant concerns  I will see her as needed     Myer Artis MD [facility] 07/19/2023, 10:08 AM  CC: Rae Bugler, MD

## 2023-08-23 ENCOUNTER — Telehealth: Payer: Self-pay | Admitting: Cardiology

## 2023-08-23 NOTE — Telephone Encounter (Signed)
Kathryn Reichert, MD  [place], RN; P [facility] Triage Caller: Unspecified (Today, 11:05 AM) I am fine with that but she has to be able to do a video visit telephone will not work       Previous [employer] and spoke with patient and informed her that I would change her visit type from office to virtual.

## 2023-08-23 NOTE — Telephone Encounter (Signed)
Pt would like to know if her appt can be switched to a phone visit so she wont have to cx

## 2023-08-24 ENCOUNTER — Ambulatory Visit: Payer: Self-pay | Admitting: Cardiology

## 2023-08-24 ENCOUNTER — Ambulatory Visit: Payer: HMO | Attending: Cardiology | Admitting: Cardiology

## 2023-08-24 VITALS — BP 133/74 | HR 94 | Ht 62.0 in | Wt 246.0 lb

## 2023-08-24 DIAGNOSIS — G4733 Obstructive sleep apnea (adult) (pediatric): Secondary | ICD-10-CM

## 2023-08-24 NOTE — Telephone Encounter (Signed)
Patient see today by Dr. Mayford Knife via MyChart Video visit.

## 2023-08-24 NOTE — Progress Notes (Signed)
SLEEP MEDICINE VIRTUAL VISIT      Virtual Visit via Video Note   Because of Kathryn Pittman's co-morbid illnesses, she is at least at moderate risk for complications without adequate follow up.  This format is felt to be most appropriate for this patient at this time.  All issues noted in this document were discussed and addressed.  A limited physical exam was performed with this format.  Please refer to the patient's chart for her consent to telehealth for [facility].       Date:  08/24/2023   ID:  DAIANNA VASQUES, DOB 03-Sep-1955, MRN [id number] The patient was identified using 2 identifiers.  Patient Location: Home Provider Location: Office/Clinic   PCP:  Tally Joe, MD   [facility] Providers Cardiologist:  None Sleep Medicine:  Armanda Magic, MD     Evaluation Performed:  Follow-Up Visit  Chief Complaint:  OSA  History of Present Illness:    Kathryn Pittman is a 68 y.o. female with a hx of severe OSA on BiPAP, HTN and obesity.  She is doing well with She PAP device and thinks that she has gotten used to it.  She tolerates the full face mask and feels the pressure is adequate.  Since going on PAP she feels rested in the am but does have some problems with sleepiness in the afternoon but sometimes does not sleep well at night.  She denies any significant or nasal dryness or nasal congestion.  She c\occasionally will have some dry mouth in the am. Her husband says that she snores mildly infrequently.   Past Medical History:  Diagnosis Date   Allergic rhinitis    Anemia    hx of low iron   Arthritis    GERD (gastroesophageal reflux disease)    with Barretts esophagus   Heart murmur    "slight"    History of hiatal hernia    has been repaired   History of kidney stones    Hypertension    Legally blind    Macular degeneration, bilateral    Morton's neuroma    Left   Onychomycosis    OSA (obstructive sleep apnea)    on BiPAP 167/13cm H2O    Osteoarthritis, shoulder    right   Psoriasis    TMJ syndrome    Left>right   Past Surgical History:  Procedure Laterality Date   ABDOMINAL HYSTERECTOMY     CHOLECYSTECTOMY     COLONOSCOPY     COLONOSCOPY WITH PROPOFOL N/A 06/24/2016   Procedure: COLONOSCOPY WITH PROPOFOL;  Surgeon: Bernette Redbird, MD;  Location: [facility] ENDOSCOPY;  Service: Endoscopy;  Laterality: N/A;   ESOPHAGOGASTRODUODENOSCOPY (EGD) WITH PROPOFOL N/A 06/24/2016   Procedure: ESOPHAGOGASTRODUODENOSCOPY (EGD) WITH PROPOFOL;  Surgeon: Bernette Redbird, MD;  Location: [facility] ENDOSCOPY;  Service: Endoscopy;  Laterality: N/A;   hiatal hernia surgery     KNEE ARTHROSCOPY       No outpatient medications have been marked as taking for the 08/24/23 encounter (Video Visit) with Quintella Reichert, MD.     Allergies:   Excedrin extra strength [aspirin-acetaminophen-caffeine]   Social History   Tobacco Use   Smoking status: Never   Smokeless tobacco: Never  Vaping Use   Vaping status: Never Used  Substance Use Topics   Alcohol use: No    Alcohol/week: 0.0 standard drinks of alcohol   Drug use: No     Family Hx: The patient's family history includes Heart attack in her mother.  ROS:  Please see the history of present illness.     All other systems reviewed and are negative.   Prior Sleep studies:   The following studies were reviewed today:  PAp compliance downoad  Labs/Other Tests and Data Reviewed:     Recent Labs: No results found for requested labs within last 365 days.   Wt Readings from Last 3 Encounters:  07/19/23 246 lb 6.4 oz (111.8 kg)  05/23/22 261 lb (118.4 kg)  05/17/21 269 lb (122 kg)     Risk Assessment/Calculations:      Objective:    Vital Signs:  There were no vitals taken for this visit.   VITAL SIGNS:  reviewed GEN:  no acute distress EYES:  sclerae anicteric, EOMI - Extraocular Movements Intact RESPIRATORY:  normal respiratory effort, symmetric expansion CARDIOVASCULAR:  no  peripheral edema SKIN:  no rash, lesions or ulcers. MUSCULOSKELETAL:  no obvious deformities. NEURO:  alert and oriented x 3, no obvious focal deficit PSYCH:  normal affect  ASSESSMENT & PLAN:    OSA - The patient is tolerating PAP therapy well without any problems. The PAP download performed by his DME was personally reviewed and interpreted by me today and showed an AHI of 1.2/hr on BiPAP at 17/13 cm H2O with 93% compliance in using more than 4 hours nightly.  The patient has been using and benefiting from PAP use and will continue to benefit from therapy.  -I encouraged her to increase the humidity to help with mouth dryness -she is running out of water in her water chamber in the am and I encouraged her to place a humidifier in her bedroom    Total time of encounter: 15 minutes total time of encounter, including 10 minutes spent in face-to-face patient care on the date of this encounter. This time includes coordination of care and counseling regarding above mentioned problem list. Remainder of non-face-to-face time involved reviewing chart documents/testing relevant to the patient encounter and documentation in the medical record. I have independently reviewed documentation from referring provider.    Medication Adjustments/Labs and Tests Ordered: Current medicines are reviewed at length with the patient today.  Concerns regarding medicines are outlined above.   Tests Ordered: No orders of the defined types were placed in this encounter.   Medication Changes: No orders of the defined types were placed in this encounter.   Follow Up:  In Person in 1 year(s)  Signed, Armanda Magic, MD  08/24/2023 10:57 AM    [facility]

## 2023-08-24 NOTE — Progress Notes (Unsigned)
 Erroneous encounter

## 2023-08-24 NOTE — Patient Instructions (Signed)
Medication Instructions:  Your physician recommends that you continue on your current medications as directed. Please refer to the Current Medication list given to you today.  *If you need a refill on your cardiac medications before your next appointment, please call your pharmacy*  Follow-Up: At [facility], you and your health needs are our priority.  As part of our continuing mission to provide you with exceptional heart care, we have created designated Provider Care Teams.  These Care Teams include your primary Cardiologist (physician) and Advanced Practice Providers (APPs -  Physician Assistants and Nurse Practitioners) who all work together to provide you with the care you need, when you need it.  Your next appointment:   1 year(s)  The format for your next appointment:   In Person  Provider:   Traci Turner, MD  

## 2023-09-26 DIAGNOSIS — M75122 Complete rotator cuff tear or rupture of left shoulder, not specified as traumatic: Secondary | ICD-10-CM | POA: Diagnosis not present

## 2023-09-27 ENCOUNTER — Telehealth: Payer: Self-pay | Admitting: *Deleted

## 2023-09-27 NOTE — Telephone Encounter (Signed)
 Dr. Mayford Knife, patient's chart was reviewed for preoperative cardiac evaluation. She was seen by you on 08/24/23. Could you comment on cardiac risk for upcoming procedure since office visit was less than 2 months ago.    Please route your response to p cv div preop.  Thank you, Levi Aland, NP-C 09/27/2023, 4:32 PM

## 2023-09-27 NOTE — Telephone Encounter (Signed)
Medical Clearance 

## 2023-09-27 NOTE — Telephone Encounter (Signed)
 Preoperative team, type of preoperative request was not noted on clearance form.  Please include either medical, pharmacy, or both types of clearance on the request form.  Thank you.  Thomasene Ripple. Criag Wicklund NP-C     09/27/2023, 3:23 PM [facility] [address] Office [phone] Fax [phone]

## 2023-09-27 NOTE — Telephone Encounter (Signed)
   Pre-operative Risk Assessment    Patient Name: Kathryn Pittman  DOB: [birth year] MRN: [id number]   Date of last office visit: 08-2023  Date of next office visit: 08-2024   Request for Surgical Clearance    Procedure:   LEFT SHOULDER SCOPE  SAD mini open Rcr  Date of Surgery:  Clearance TBD                                Surgeon:  DR Malon Kindle  Surgeon's Group or Practice Name:  [facility] Phone number:  [phone] Fax number:  [phone]   Type of Clearance Requested:    Type of Anesthesia:   CHOICE ANESTHESIA    Signed, Oleta Mouse   09/27/2023, 2:52 PM

## 2023-09-28 ENCOUNTER — Telehealth: Payer: Self-pay | Admitting: *Deleted

## 2023-09-28 NOTE — Telephone Encounter (Signed)
   Name: DEBE ANFINSON  DOB: 1955/08/21  MRN: [id number]  Primary Cardiologist: None  Chart reviewed as part of pre-operative protocol coverage. Because of Danyal Adorno Loredo's past medical history and time since last visit, she will require a follow-up telephone visit in order to better assess preoperative cardiovascular risk.  Pre-op covering staff: - Please schedule appointment and call patient to inform them. If patient already had an upcoming appointment within acceptable timeframe, please add "pre-op clearance" to the appointment notes so provider is aware. - Please contact requesting surgeon's office via preferred method (i.e, phone, fax) to inform them of need for appointment prior to surgery.  No medications indicated as needing held.   Sharlene Dory, PA-C  09/28/2023, 7:51 AM

## 2023-09-28 NOTE — Telephone Encounter (Signed)
 Pt has been scheduled a tele visit 09/29/23, consent on file / medications reconciled.

## 2023-09-28 NOTE — Telephone Encounter (Signed)
 Pt has been scheduled a tele visit, 09/29/23 2:40.  Consent on file / medications reconciled.    Patient Consent for Virtual Visit        Kathryn Pittman has provided verbal consent on 09/28/2023 for a virtual visit (video or telephone).   CONSENT FOR VIRTUAL VISIT FOR:  Kathryn Pittman  By participating in this virtual visit I agree to the following:  I hereby voluntarily request, consent and authorize [facility] and its employed or contracted physicians, physician assistants, nurse practitioners or other licensed health care professionals (the Practitioner), to provide me with telemedicine health care services (the "Services") as deemed necessary by the treating Practitioner. I acknowledge and consent to receive the Services by the Practitioner via telemedicine. I understand that the telemedicine visit will involve communicating with the Practitioner through live audiovisual communication technology and the disclosure of certain medical information by electronic transmission. I acknowledge that I have been given the opportunity to request an in-person assessment or other available alternative prior to the telemedicine visit and am voluntarily participating in the telemedicine visit.  I understand that I have the right to withhold or withdraw my consent to the use of telemedicine in the course of my care at any time, without affecting my right to future care or treatment, and that the Practitioner or I may terminate the telemedicine visit at any time. I understand that I have the right to inspect all information obtained and/or recorded in the course of the telemedicine visit and may receive copies of available information for a reasonable fee.  I understand that some of the potential risks of receiving the Services via telemedicine include:  Delay or interruption in medical evaluation due to technological equipment failure or disruption; Information transmitted may not be sufficient (e.g.  poor resolution of images) to allow for appropriate medical decision making by the Practitioner; and/or  In rare instances, security protocols could fail, causing a breach of personal health information.  Furthermore, I acknowledge that it is my responsibility to provide information about my medical history, conditions and care that is complete and accurate to the best of my ability. I acknowledge that Practitioner's advice, recommendations, and/or decision may be based on factors not within their control, such as incomplete or inaccurate data provided by me or distortions of diagnostic images or specimens that may result from electronic transmissions. I understand that the practice of medicine is not an exact science and that Practitioner makes no warranties or guarantees regarding treatment outcomes. I acknowledge that a copy of this consent can be made available to me via my patient portal [facility] MyChart), or I can request a printed copy by calling the office of [facility].    I understand that my insurance will be billed for this visit.   I have read or had this consent read to me. I understand the contents of this consent, which adequately explains the benefits and risks of the Services being provided via telemedicine.  I have been provided ample opportunity to ask questions regarding this consent and the Services and have had my questions answered to my satisfaction. I give my informed consent for the services to be provided through the use of telemedicine in my medical care

## 2023-09-29 ENCOUNTER — Ambulatory Visit

## 2023-09-29 ENCOUNTER — Telehealth: Payer: Self-pay | Admitting: *Deleted

## 2023-09-29 NOTE — Progress Notes (Signed)
 Rescheduled due to need for an in person visit.   Sharlene Dory, PA-C

## 2023-09-29 NOTE — Telephone Encounter (Signed)
 S/w pt per Jari Favre, PA. Pt was scheduled for tele pre op appt today but needed in office appt. Tele canceled and pt will see Jacolyn Reedy, PA April 8 for in office pre op appt. Canceled tele visit.

## 2023-10-02 NOTE — Progress Notes (Signed)
  Cardiology Office Note:  .   Date:  10/10/2023  ID:  Kathryn Pittman, DOB [birth year], MRN [id number] PCP: Kathryn Joe, MD  [facility] Providers Cardiologist:  None Sleep Medicine:  Kathryn Magic, MD    History of Present Illness: .   Kathryn Pittman is a 68 y.o. female  with a hx of severe OSA on BiPAP, HTN and obesity, DM2.  Patient is on my schedule for preop clearance for shoulder scope. She was walking 3 times a week 1 mile.Walks 45 min twice a week, some chair exercises for knee and shoulder. Denies chest pain, palpitations, dyspnea, edema. Has lost about 50 lbs by eating smaller portions and Pittman carb diet.   ROS:    Studies Reviewed: Marland Kitchen    EKG Interpretation Date/Time:  Tuesday October 10 2023 08:39:17 EDT Ventricular Rate:  92 PR Interval:  198 QRS Duration:  78 QT Interval:  360 QTC Calculation: 445 R Axis:   -11  Text Interpretation: Normal sinus rhythm Normal ECG When compared with ECG of 04-Sep-1998 08:56, No significant change was found Confirmed by Kathryn Pittman [id number]) on 10/10/2023 8:42:38 AM    Prior CV Studies:      Risk Assessment/Calculations:             Physical Exam:   VS:  BP 120/72 (BP Location: Right Arm, Patient Position: Sitting, Cuff Size: Normal)   Pulse 91   Resp 16   Ht 5\' 2"  (1.575 m)   Wt 249 lb 12.8 oz (113.3 kg)   SpO2 97%   BMI 45.69 kg/m    Wt Readings from Last 3 Encounters:  10/10/23 249 lb 12.8 oz (113.3 kg)  08/24/23 246 lb (111.6 kg)  07/19/23 246 lb 6.4 oz (111.8 kg)    GEN: Obese, in no acute distress NECK: No JVD; No carotid bruits CARDIAC:  RRR, 2/6 systolic murmur RSB RESPIRATORY:  Clear to auscultation without rales, wheezing or rhonchi  ABDOMEN: Soft, non-tender, non-distended EXTREMITIES:  No edema; No deformity   ASSESSMENT AND PLAN: .    Preop clearance for L shoulder scope by Dr. Beverely Pittman. Patient with severe OSA compliant with BiPAP, HTN, obesity. No history of CAD. Pittman periop risk of major cardiac  events and METs >7, can proceed with above surgery without further cardiac testing. According to the Revised Cardiac Risk Index (RCRI), her Perioperative Risk of Major Cardiac Event is (%): 0.4  Her Functional Capacity in METs is: 7.01 according to the [employer] Activity Status Index (DASI).   Severe OSA on BiPAP followed by Dr. Mayford Pittman  HTN-well controlled on telmisartan  Obesity-diet, exercise and weight loss discussed-has lost 50 lbs slowly  HLD on Crestor LDL 67 06/2023  Systolic murmur suspect aortic valve-order echo for baseline.        Dispo: f/u Dr. Mayford Pittman 08/2024  Signed, Kathryn Reedy, PA-C

## 2023-10-03 DIAGNOSIS — J189 Pneumonia, unspecified organism: Secondary | ICD-10-CM | POA: Diagnosis not present

## 2023-10-10 ENCOUNTER — Ambulatory Visit: Attending: Cardiology | Admitting: Physician Assistant

## 2023-10-10 VITALS — BP 120/72 | HR 91 | Resp 16 | Ht 62.0 in | Wt 249.8 lb

## 2023-10-10 DIAGNOSIS — E7849 Other hyperlipidemia: Secondary | ICD-10-CM | POA: Diagnosis not present

## 2023-10-10 DIAGNOSIS — R011 Cardiac murmur, unspecified: Secondary | ICD-10-CM | POA: Diagnosis not present

## 2023-10-10 DIAGNOSIS — Z01818 Encounter for other preprocedural examination: Secondary | ICD-10-CM

## 2023-10-10 DIAGNOSIS — G4733 Obstructive sleep apnea (adult) (pediatric): Secondary | ICD-10-CM | POA: Diagnosis not present

## 2023-10-10 DIAGNOSIS — I10 Essential (primary) hypertension: Secondary | ICD-10-CM | POA: Diagnosis not present

## 2023-10-10 NOTE — Patient Instructions (Signed)
 Medication Instructions:  Your physician recommends that you continue on your current medications as directed. Please refer to the Current Medication list given to you today.  *If you need a refill on your cardiac medications before your next appointment, please call your pharmacy*  Lab Work: NONE If you have labs (blood work) drawn today and your tests are completely normal, you will receive your results only by: MyChart Message (if you have MyChart) OR A paper copy in the mail If you have any lab test that is abnormal or we need to change your treatment, we will call you to review the results.  Testing/Procedures: Your physician has requested that you have an echocardiogram. Echocardiography is a painless test that uses sound waves to create images of your heart. It provides your doctor with information about the size and shape of your heart and how well your heart's chambers and valves are working. This procedure takes approximately one hour. There are no restrictions for this procedure. Please do NOT wear cologne, perfume, aftershave, or lotions (deodorant is allowed). Please arrive 15 minutes prior to your appointment time.  Please note: We ask at that you not bring children with you during ultrasound (echo/ vascular) testing. Due to room size and safety concerns, children are not allowed in the ultrasound rooms during exams. Our front office staff cannot provide observation of children in our lobby area while testing is being conducted. An adult accompanying a patient to their appointment will only be allowed in the ultrasound room at the discretion of the ultrasound technician under special circumstances. We apologize for any inconvenience.   Follow-Up: At [facility], you and your health needs are our priority.  As part of our continuing mission to provide you with exceptional heart care, our providers are all part of one team.  This team includes your primary Cardiologist  (physician) and Advanced Practice Providers or APPs (Physician Assistants and Nurse Practitioners) who all work together to provide you with the care you need, when you need it.  Your next appointment:   FEBRUARY 2026   Provider:   DR. Mayford Knife   We recommend signing up for the patient portal called "MyChart".  Sign up information is provided on this After Visit Summary.  MyChart is used to connect with patients for Virtual Visits (Telemedicine).  Patients are able to view lab/test results, encounter notes, upcoming appointments, etc.  Non-urgent messages can be sent to your provider as well.   To learn more about what you can do with MyChart, go to [email or web address].   Other Instructions Heart-Healthy Eating Plan Eating a healthy diet is important for the health of your heart. A heart-healthy eating plan includes: Eating less unhealthy fats. Eating more healthy fats. Eating less salt in your food. Salt is also called sodium. Making other changes in your diet. Talk with your doctor or a diet specialist (dietitian) to create an eating plan that is right for you. What is my plan? Your doctor may recommend an eating plan that includes: Total fat: ______% or less of total calories a day. Saturated fat: ______% or less of total calories a day. Cholesterol: less than _________mg a day. Sodium: less than _________mg a day. What are tips for following this plan? Cooking Avoid frying your food. Try to bake, boil, grill, or broil it instead. You can also reduce fat by: Removing the skin from poultry. Removing all visible fats from meats. Steaming vegetables in water or broth. Meal planning  At meals, divide your  plate into four equal parts: Fill one-half of your plate with vegetables and green salads. Fill one-fourth of your plate with whole grains. Fill one-fourth of your plate with lean protein foods. Eat 2-4 cups of vegetables per day. One cup of vegetables is: 1 cup (91 g)  broccoli or cauliflower florets. 2 medium carrots. 1 large bell pepper. 1 large sweet potato. 1 large tomato. 1 medium white potato. 2 cups (150 g) raw leafy greens. Eat 1-2 cups of fruit per day. One cup of fruit is: 1 small apple 1 large banana 1 cup (237 g) mixed fruit, 1 large orange,  cup (82 g) dried fruit, 1 cup (240 mL) 100% fruit juice. Eat more foods that have soluble fiber. These are apples, broccoli, carrots, beans, peas, and barley. Try to get 20-30 g of fiber per day. Eat 4-5 servings of nuts, legumes, and seeds per week: 1 serving of dried beans or legumes equals  cup (90 g) cooked. 1 serving of nuts is  oz (12 almonds, 24 pistachios, or 7 walnut halves). 1 serving of seeds equals  oz (8 g). General information Eat more home-cooked food. Eat less restaurant, buffet, and fast food. Limit or avoid alcohol. Limit foods that are high in starch and sugar. Avoid fried foods. Lose weight if you are overweight. Keep track of how much salt (sodium) you eat. This is important if you have high blood pressure. Ask your doctor to tell you more about this. Try to add vegetarian meals each week. Fats Choose healthy fats. These include olive oil and canola oil, flaxseeds, walnuts, almonds, and seeds. Eat more omega-3 fats. These include salmon, mackerel, sardines, tuna, flaxseed oil, and ground flaxseeds. Try to eat fish at least 2 times each week. Check food labels. Avoid foods with trans fats or high amounts of saturated fat. Limit saturated fats. These are often found in animal products, such as meats, butter, and cream. These are also found in plant foods, such as palm oil, palm kernel oil, and coconut oil. Avoid foods with partially hydrogenated oils in them. These have trans fats. Examples are stick margarine, some tub margarines, cookies, crackers, and other baked goods. What foods should I eat? Fruits All fresh, canned (in natural juice), or frozen  fruits. Vegetables Fresh or frozen vegetables (raw, steamed, roasted, or grilled). Green salads. Grains Most grains. Choose whole wheat and whole grains most of the time. Rice and pasta, including [place] rice and pastas made with whole wheat. Meats and other proteins Lean, well-trimmed beef, veal, pork, and lamb. Chicken and [place] without skin. All fish and shellfish. Wild duck, rabbit, pheasant, and venison. Egg whites or low-cholesterol egg substitutes. Dried beans, peas, lentils, and tofu. Seeds and most nuts. Dairy Low-fat or nonfat cheeses, including ricotta and mozzarella. Skim or 1% milk that is liquid, powdered, or evaporated. Buttermilk that is made with low-fat milk. Nonfat or low-fat yogurt. Fats and oils Non-hydrogenated (trans-free) margarines. Vegetable oils, including soybean, sesame, sunflower, olive, peanut, safflower, corn, canola, and cottonseed. Salad dressings or mayonnaise made with a vegetable oil. Beverages Mineral water. Coffee and tea. Diet carbonated beverages. Sweets and desserts Sherbet, gelatin, and fruit ice. Small amounts of dark chocolate. Limit all sweets and desserts. Seasonings and condiments All seasonings and condiments. The items listed above may not be a complete list of foods and drinks you can eat. Contact a dietitian for more options. What foods should I avoid? Fruits Canned fruit in heavy syrup. Fruit in cream or butter sauce. [place]  fruit. Limit coconut. Vegetables Vegetables cooked in cheese, cream, or butter sauce. Fried vegetables. Grains Breads that are made with saturated or trans fats, oils, or whole milk. Croissants. Sweet rolls. Donuts. High-fat crackers, such as cheese crackers. Meats and other proteins Fatty meats, such as hot dogs, ribs, sausage, bacon, rib-eye roast or steak. High-fat deli meats, such as salami and bologna. Caviar. Domestic duck and goose. Organ meats, such as liver. Dairy Cream, sour cream, cream cheese, and  creamed cottage cheese. Whole-milk cheeses. Whole or 2% milk that is liquid, evaporated, or condensed. Whole buttermilk. Cream sauce or high-fat cheese sauce. Yogurt that is made from whole milk. Fats and oils Meat fat, or shortening. Cocoa butter, hydrogenated oils, palm oil, coconut oil, palm kernel oil. Solid fats and shortenings, including bacon fat, salt pork, lard, and butter. Nondairy cream substitutes. Salad dressings with cheese or sour cream. Beverages Regular sodas and juice drinks with added sugar. Sweets and desserts Frosting. Pudding. Cookies. Cakes. Pies. Milk chocolate or white chocolate. Buttered syrups. Full-fat ice cream or ice cream drinks. The items listed above may not be a complete list of foods and drinks to avoid. Contact a dietitian for more information. Summary Heart-healthy meal planning includes eating less unhealthy fats, eating more healthy fats, and making other changes in your diet. Eat a balanced diet. This includes fruits and vegetables, low-fat or nonfat dairy, lean protein, nuts and legumes, whole grains, and heart-healthy oils and fats. This information is not intended to replace advice given to you by your health care provider. Make sure you discuss any questions you have with your health care provider. Document Revised: 07/26/2021 Document Reviewed: 07/26/2021 Elsevier Patient Education  2024 Else[employer]      1st Floor: - Lobby - Registration  - Pharmacy  - Lab - Cafe  2nd Floor: - PV Lab - Diagnostic Testing (echo, CT, nuclear med)  3rd Floor: - Vacant  4th Floor: - TCTS (cardiothoracic surgery) - AFib Clinic - Structural Heart Clinic - Vascular Surgery  - Vascular Ultrasound  5th Floor: - [facility] (general and EP) - Clinical Pharmacy for coumadin, hypertension, lipid, weight-loss medications, and med management appointments    Valet parking services will be available as well.

## 2023-10-30 DIAGNOSIS — R0989 Other specified symptoms and signs involving the circulatory and respiratory systems: Secondary | ICD-10-CM | POA: Diagnosis not present

## 2023-10-30 DIAGNOSIS — J189 Pneumonia, unspecified organism: Secondary | ICD-10-CM | POA: Diagnosis not present

## 2023-10-30 DIAGNOSIS — R059 Cough, unspecified: Secondary | ICD-10-CM | POA: Diagnosis not present

## 2023-10-30 DIAGNOSIS — K219 Gastro-esophageal reflux disease without esophagitis: Secondary | ICD-10-CM | POA: Diagnosis not present

## 2023-11-10 ENCOUNTER — Other Ambulatory Visit (HOSPITAL_COMMUNITY)

## 2023-11-23 ENCOUNTER — Telehealth: Payer: Self-pay | Admitting: Cardiology

## 2023-11-23 NOTE — Telephone Encounter (Signed)
 Patient wants a call back to confirm if she will still need to have an echocardiogram prior to her surgery.

## 2023-11-23 NOTE — Telephone Encounter (Signed)
 Spoke with patient and she is aware that she will need echo to be cleared for surgery.  She would like to get scheduled for echo. Her surgery date is scheduled for 6/2. She would like to know if she can get scheduled before then

## 2023-11-28 ENCOUNTER — Ambulatory Visit (HOSPITAL_COMMUNITY)
Admission: RE | Admit: 2023-11-28 | Discharge: 2023-11-28 | Disposition: A | Discharge status: Home / Self Care | Source: Ambulatory Visit | Attending: Cardiology | Admitting: Cardiology

## 2023-11-28 DIAGNOSIS — R011 Cardiac murmur, unspecified: Secondary | ICD-10-CM | POA: Diagnosis not present

## 2023-11-28 LAB — ECHOCARDIOGRAM COMPLETE
Area-P 1/2: 4.74 cm2
S' Lateral: 3.1 cm

## 2023-11-29 ENCOUNTER — Ambulatory Visit: Payer: Self-pay | Admitting: Physician Assistant

## 2023-12-04 DIAGNOSIS — S46012A Strain of muscle(s) and tendon(s) of the rotator cuff of left shoulder, initial encounter: Secondary | ICD-10-CM | POA: Diagnosis not present

## 2023-12-04 DIAGNOSIS — M24112 Other articular cartilage disorders, left shoulder: Secondary | ICD-10-CM | POA: Diagnosis not present

## 2023-12-04 DIAGNOSIS — Y999 Unspecified external cause status: Secondary | ICD-10-CM | POA: Diagnosis not present

## 2023-12-04 DIAGNOSIS — S43432A Superior glenoid labrum lesion of left shoulder, initial encounter: Secondary | ICD-10-CM | POA: Diagnosis not present

## 2023-12-04 DIAGNOSIS — G8918 Other acute postprocedural pain: Secondary | ICD-10-CM | POA: Diagnosis not present

## 2023-12-04 DIAGNOSIS — S46212A Strain of muscle, fascia and tendon of other parts of biceps, left arm, initial encounter: Secondary | ICD-10-CM | POA: Diagnosis not present

## 2023-12-04 DIAGNOSIS — M7542 Impingement syndrome of left shoulder: Secondary | ICD-10-CM | POA: Diagnosis not present

## 2023-12-04 DIAGNOSIS — X58XXXA Exposure to other specified factors, initial encounter: Secondary | ICD-10-CM | POA: Diagnosis not present

## 2023-12-04 DIAGNOSIS — M19012 Primary osteoarthritis, left shoulder: Secondary | ICD-10-CM | POA: Diagnosis not present

## 2023-12-04 DIAGNOSIS — M7502 Adhesive capsulitis of left shoulder: Secondary | ICD-10-CM | POA: Diagnosis not present

## 2023-12-07 DIAGNOSIS — M25612 Stiffness of left shoulder, not elsewhere classified: Secondary | ICD-10-CM | POA: Diagnosis not present

## 2023-12-07 DIAGNOSIS — G4733 Obstructive sleep apnea (adult) (pediatric): Secondary | ICD-10-CM | POA: Diagnosis not present

## 2023-12-07 DIAGNOSIS — M25512 Pain in left shoulder: Secondary | ICD-10-CM | POA: Diagnosis not present

## 2023-12-11 DIAGNOSIS — M25612 Stiffness of left shoulder, not elsewhere classified: Secondary | ICD-10-CM | POA: Diagnosis not present

## 2023-12-11 DIAGNOSIS — M25512 Pain in left shoulder: Secondary | ICD-10-CM | POA: Diagnosis not present

## 2023-12-14 DIAGNOSIS — M25512 Pain in left shoulder: Secondary | ICD-10-CM | POA: Diagnosis not present

## 2023-12-14 DIAGNOSIS — M25612 Stiffness of left shoulder, not elsewhere classified: Secondary | ICD-10-CM | POA: Diagnosis not present

## 2023-12-19 DIAGNOSIS — M25512 Pain in left shoulder: Secondary | ICD-10-CM | POA: Diagnosis not present

## 2023-12-19 DIAGNOSIS — M25612 Stiffness of left shoulder, not elsewhere classified: Secondary | ICD-10-CM | POA: Diagnosis not present

## 2023-12-21 DIAGNOSIS — M25512 Pain in left shoulder: Secondary | ICD-10-CM | POA: Diagnosis not present

## 2023-12-21 DIAGNOSIS — M25612 Stiffness of left shoulder, not elsewhere classified: Secondary | ICD-10-CM | POA: Diagnosis not present

## 2023-12-26 DIAGNOSIS — H2513 Age-related nuclear cataract, bilateral: Secondary | ICD-10-CM | POA: Diagnosis not present

## 2023-12-26 DIAGNOSIS — H353134 Nonexudative age-related macular degeneration, bilateral, advanced atrophic with subfoveal involvement: Secondary | ICD-10-CM | POA: Diagnosis not present

## 2023-12-26 DIAGNOSIS — H3553 Other dystrophies primarily involving the sensory retina: Secondary | ICD-10-CM | POA: Diagnosis not present

## 2023-12-28 DIAGNOSIS — M25612 Stiffness of left shoulder, not elsewhere classified: Secondary | ICD-10-CM | POA: Diagnosis not present

## 2023-12-28 DIAGNOSIS — M25512 Pain in left shoulder: Secondary | ICD-10-CM | POA: Diagnosis not present

## 2023-12-29 DIAGNOSIS — K2270 Barrett's esophagus without dysplasia: Secondary | ICD-10-CM | POA: Diagnosis not present

## 2023-12-29 DIAGNOSIS — R011 Cardiac murmur, unspecified: Secondary | ICD-10-CM | POA: Diagnosis not present

## 2023-12-29 DIAGNOSIS — Z9889 Other specified postprocedural states: Secondary | ICD-10-CM | POA: Diagnosis not present

## 2023-12-29 DIAGNOSIS — G4733 Obstructive sleep apnea (adult) (pediatric): Secondary | ICD-10-CM | POA: Diagnosis not present

## 2023-12-29 DIAGNOSIS — Z85820 Personal history of malignant melanoma of skin: Secondary | ICD-10-CM | POA: Diagnosis not present

## 2023-12-29 DIAGNOSIS — H548 Legal blindness, as defined in USA: Secondary | ICD-10-CM | POA: Diagnosis not present

## 2023-12-29 DIAGNOSIS — I10 Essential (primary) hypertension: Secondary | ICD-10-CM | POA: Diagnosis not present

## 2023-12-29 DIAGNOSIS — E1169 Type 2 diabetes mellitus with other specified complication: Secondary | ICD-10-CM | POA: Diagnosis not present

## 2023-12-29 DIAGNOSIS — L409 Psoriasis, unspecified: Secondary | ICD-10-CM | POA: Diagnosis not present

## 2023-12-29 DIAGNOSIS — K219 Gastro-esophageal reflux disease without esophagitis: Secondary | ICD-10-CM | POA: Diagnosis not present

## 2023-12-29 DIAGNOSIS — J309 Allergic rhinitis, unspecified: Secondary | ICD-10-CM | POA: Diagnosis not present

## 2023-12-29 DIAGNOSIS — E559 Vitamin D deficiency, unspecified: Secondary | ICD-10-CM | POA: Diagnosis not present

## 2024-01-01 DIAGNOSIS — M25612 Stiffness of left shoulder, not elsewhere classified: Secondary | ICD-10-CM | POA: Diagnosis not present

## 2024-01-01 DIAGNOSIS — M25512 Pain in left shoulder: Secondary | ICD-10-CM | POA: Diagnosis not present

## 2024-01-03 DIAGNOSIS — M25612 Stiffness of left shoulder, not elsewhere classified: Secondary | ICD-10-CM | POA: Diagnosis not present

## 2024-01-03 DIAGNOSIS — M25512 Pain in left shoulder: Secondary | ICD-10-CM | POA: Diagnosis not present

## 2024-01-09 DIAGNOSIS — M25512 Pain in left shoulder: Secondary | ICD-10-CM | POA: Diagnosis not present

## 2024-01-09 DIAGNOSIS — M25612 Stiffness of left shoulder, not elsewhere classified: Secondary | ICD-10-CM | POA: Diagnosis not present

## 2024-01-12 DIAGNOSIS — M25512 Pain in left shoulder: Secondary | ICD-10-CM | POA: Diagnosis not present

## 2024-01-12 DIAGNOSIS — M25612 Stiffness of left shoulder, not elsewhere classified: Secondary | ICD-10-CM | POA: Diagnosis not present

## 2024-01-16 ENCOUNTER — Ambulatory Visit: Admitting: Pulmonary Disease

## 2024-01-16 DIAGNOSIS — M25612 Stiffness of left shoulder, not elsewhere classified: Secondary | ICD-10-CM | POA: Diagnosis not present

## 2024-01-16 DIAGNOSIS — M25512 Pain in left shoulder: Secondary | ICD-10-CM | POA: Diagnosis not present

## 2024-01-19 DIAGNOSIS — M25512 Pain in left shoulder: Secondary | ICD-10-CM | POA: Diagnosis not present

## 2024-01-19 DIAGNOSIS — M25612 Stiffness of left shoulder, not elsewhere classified: Secondary | ICD-10-CM | POA: Diagnosis not present

## 2024-01-22 DIAGNOSIS — M25512 Pain in left shoulder: Secondary | ICD-10-CM | POA: Diagnosis not present

## 2024-01-22 DIAGNOSIS — M25612 Stiffness of left shoulder, not elsewhere classified: Secondary | ICD-10-CM | POA: Diagnosis not present

## 2024-01-31 DIAGNOSIS — M25612 Stiffness of left shoulder, not elsewhere classified: Secondary | ICD-10-CM | POA: Diagnosis not present

## 2024-01-31 DIAGNOSIS — M25512 Pain in left shoulder: Secondary | ICD-10-CM | POA: Diagnosis not present

## 2024-02-07 DIAGNOSIS — M25511 Pain in right shoulder: Secondary | ICD-10-CM | POA: Diagnosis not present

## 2024-02-08 DIAGNOSIS — M25512 Pain in left shoulder: Secondary | ICD-10-CM | POA: Diagnosis not present

## 2024-02-08 DIAGNOSIS — M25612 Stiffness of left shoulder, not elsewhere classified: Secondary | ICD-10-CM | POA: Diagnosis not present

## 2024-02-13 DIAGNOSIS — M25512 Pain in left shoulder: Secondary | ICD-10-CM | POA: Diagnosis not present

## 2024-02-13 DIAGNOSIS — M25612 Stiffness of left shoulder, not elsewhere classified: Secondary | ICD-10-CM | POA: Diagnosis not present

## 2024-02-21 DIAGNOSIS — M25612 Stiffness of left shoulder, not elsewhere classified: Secondary | ICD-10-CM | POA: Diagnosis not present

## 2024-02-21 DIAGNOSIS — M25512 Pain in left shoulder: Secondary | ICD-10-CM | POA: Diagnosis not present

## 2024-02-27 DIAGNOSIS — M25612 Stiffness of left shoulder, not elsewhere classified: Secondary | ICD-10-CM | POA: Diagnosis not present

## 2024-02-27 DIAGNOSIS — M25512 Pain in left shoulder: Secondary | ICD-10-CM | POA: Diagnosis not present

## 2024-03-05 DIAGNOSIS — M25612 Stiffness of left shoulder, not elsewhere classified: Secondary | ICD-10-CM | POA: Diagnosis not present

## 2024-03-05 DIAGNOSIS — M25512 Pain in left shoulder: Secondary | ICD-10-CM | POA: Diagnosis not present

## 2024-03-08 DIAGNOSIS — M25512 Pain in left shoulder: Secondary | ICD-10-CM | POA: Diagnosis not present

## 2024-03-08 DIAGNOSIS — M25612 Stiffness of left shoulder, not elsewhere classified: Secondary | ICD-10-CM | POA: Diagnosis not present

## 2024-03-12 DIAGNOSIS — M25512 Pain in left shoulder: Secondary | ICD-10-CM | POA: Diagnosis not present

## 2024-03-14 DIAGNOSIS — M25512 Pain in left shoulder: Secondary | ICD-10-CM | POA: Diagnosis not present

## 2024-03-18 DIAGNOSIS — Z111 Encounter for screening for respiratory tuberculosis: Secondary | ICD-10-CM | POA: Diagnosis not present

## 2024-03-18 DIAGNOSIS — M25512 Pain in left shoulder: Secondary | ICD-10-CM | POA: Diagnosis not present

## 2024-03-20 DIAGNOSIS — D225 Melanocytic nevi of trunk: Secondary | ICD-10-CM | POA: Diagnosis not present

## 2024-03-20 DIAGNOSIS — Z85820 Personal history of malignant melanoma of skin: Secondary | ICD-10-CM | POA: Diagnosis not present

## 2024-03-20 DIAGNOSIS — D1801 Hemangioma of skin and subcutaneous tissue: Secondary | ICD-10-CM | POA: Diagnosis not present

## 2024-03-20 DIAGNOSIS — L400 Psoriasis vulgaris: Secondary | ICD-10-CM | POA: Diagnosis not present

## 2024-03-20 DIAGNOSIS — D2272 Melanocytic nevi of left lower limb, including hip: Secondary | ICD-10-CM | POA: Diagnosis not present

## 2024-03-20 DIAGNOSIS — L814 Other melanin hyperpigmentation: Secondary | ICD-10-CM | POA: Diagnosis not present

## 2024-03-20 DIAGNOSIS — Z79899 Other long term (current) drug therapy: Secondary | ICD-10-CM | POA: Diagnosis not present

## 2024-03-20 DIAGNOSIS — Z85828 Personal history of other malignant neoplasm of skin: Secondary | ICD-10-CM | POA: Diagnosis not present

## 2024-03-20 DIAGNOSIS — L821 Other seborrheic keratosis: Secondary | ICD-10-CM | POA: Diagnosis not present

## 2024-03-20 DIAGNOSIS — G4733 Obstructive sleep apnea (adult) (pediatric): Secondary | ICD-10-CM | POA: Diagnosis not present

## 2024-03-21 DIAGNOSIS — M25512 Pain in left shoulder: Secondary | ICD-10-CM | POA: Diagnosis not present

## 2024-03-21 DIAGNOSIS — M25612 Stiffness of left shoulder, not elsewhere classified: Secondary | ICD-10-CM | POA: Diagnosis not present

## 2024-04-16 DIAGNOSIS — M25612 Stiffness of left shoulder, not elsewhere classified: Secondary | ICD-10-CM | POA: Diagnosis not present

## 2024-04-16 DIAGNOSIS — M25511 Pain in right shoulder: Secondary | ICD-10-CM | POA: Diagnosis not present

## 2024-04-16 DIAGNOSIS — M25512 Pain in left shoulder: Secondary | ICD-10-CM | POA: Diagnosis not present

## 2024-04-18 DIAGNOSIS — M25511 Pain in right shoulder: Secondary | ICD-10-CM | POA: Diagnosis not present

## 2024-04-18 DIAGNOSIS — M25612 Stiffness of left shoulder, not elsewhere classified: Secondary | ICD-10-CM | POA: Diagnosis not present

## 2024-04-23 DIAGNOSIS — R351 Nocturia: Secondary | ICD-10-CM | POA: Diagnosis not present

## 2024-04-23 DIAGNOSIS — N393 Stress incontinence (female) (male): Secondary | ICD-10-CM | POA: Diagnosis not present

## 2024-04-23 DIAGNOSIS — M25612 Stiffness of left shoulder, not elsewhere classified: Secondary | ICD-10-CM | POA: Diagnosis not present

## 2024-04-23 DIAGNOSIS — M25512 Pain in left shoulder: Secondary | ICD-10-CM | POA: Diagnosis not present

## 2024-04-25 DIAGNOSIS — M25511 Pain in right shoulder: Secondary | ICD-10-CM | POA: Diagnosis not present

## 2024-04-30 DIAGNOSIS — M25612 Stiffness of left shoulder, not elsewhere classified: Secondary | ICD-10-CM | POA: Diagnosis not present

## 2024-04-30 DIAGNOSIS — M25512 Pain in left shoulder: Secondary | ICD-10-CM | POA: Diagnosis not present

## 2024-05-02 DIAGNOSIS — M25612 Stiffness of left shoulder, not elsewhere classified: Secondary | ICD-10-CM | POA: Diagnosis not present

## 2024-05-02 DIAGNOSIS — M25512 Pain in left shoulder: Secondary | ICD-10-CM | POA: Diagnosis not present

## 2024-05-07 DIAGNOSIS — M25612 Stiffness of left shoulder, not elsewhere classified: Secondary | ICD-10-CM | POA: Diagnosis not present

## 2024-05-07 DIAGNOSIS — M25512 Pain in left shoulder: Secondary | ICD-10-CM | POA: Diagnosis not present

## 2024-05-09 DIAGNOSIS — M25512 Pain in left shoulder: Secondary | ICD-10-CM | POA: Diagnosis not present

## 2024-05-09 DIAGNOSIS — M25612 Stiffness of left shoulder, not elsewhere classified: Secondary | ICD-10-CM | POA: Diagnosis not present

## 2024-05-20 ENCOUNTER — Ambulatory Visit: Payer: Self-pay | Admitting: Pulmonary Disease

## 2024-05-20 NOTE — Telephone Encounter (Signed)
 I called and spoke with patient, Advised that since we have not seen her since January of 2025 she needs to be seen by a provider.  She was agreeable to going to the [facility] location and seeing Candis Edis PA-C at 11 am.  I advised her to arrive by 10:45 am for check in.  She verbalized understanding.  Nothing further needed.

## 2024-05-20 NOTE — Telephone Encounter (Signed)
 FYI Only or Action Required?: Action required by provider: request for appointment.  Patient is followed in Pulmonology for , last seen on 07/19/2023 by Neda Jennet LABOR, MD.  Called Nurse Triage reporting Wheezing & cough & chest congestion  Symptoms began several weeks ago. - (pt states that this has been going on a long time.)  Interventions attempted: Rescue inhaler. Increased fluids  Symptoms are: unchanged.  Triage Disposition: See HCP Within 4 Hours (Or PCP Triage)  Patient/caregiver understands and will follow disposition?: Yes                       E2C2 Pulmonary Triage - Initial Assessment Questions "Chief Complaint (e.g., cough, sob, wheezing, fever, chills, sweat or additional symptoms) *Go to specific symptom protocol after initial questions. Cough, wheezing  "How long have symptoms been present?" 3 weeks - really started 2024 with pneumonia  Have you tested for COVID or Flu? Note: If not, ask patient if a home test can be taken. If so, instruct patient to call back for positive results. Yes - 2 weeks ago - it was negative  MEDICINES:   "Have you used any OTC meds to help with symptoms?" Yes If yes, ask "What medications?" Mucinex and allergy pill - Claritin  "Have you used your inhalers/maintenance medication?" Yes If yes, "What medications?" inhaler  If inhaler, ask "How many puffs and how often?" Note: Review instructions on medication in the chart. Not a whole lot - uses for 2-3 nights then things get better  OXYGEN: "Do you wear supplemental oxygen?" No If yes, "How many liters are you supposed to use?" no  "Do you monitor your oxygen levels?" No If yes, What is your reading (oxygen level) today? na  What is your usual oxygen saturation reading?  (Note: Pulmonary O2 sats should be 90% or greater) na                    Copied from CRM #[id number]. Topic: Clinical - Red Word Triage >> May 20, 2024  8:28 AM  Essie A wrote: Red Word that prompted transfer to Nurse Triage: Wheezing, chest congestion, cough that keeps re-occurring, running and stuffy nose. Reason for Disposition  [1] MILD difficulty breathing (e.g., minimal/no SOB at rest, SOB with walking, pulse < 100) AND [2] NEW-onset or WORSE than normal  Answer Assessment - Initial Assessment Questions 1. RESPIRATORY STATUS: Describe your breathing? (e.g., wheezing, shortness of breath, unable to speak, severe coughing)      Cough, wheezing 2. ONSET: When did this breathing problem begin?      Ongoing worsening last 2 weeks 3. PATTERN Does the difficult breathing come and go, or has it been constant since it started?      Comes and goes 4. SEVERITY: How bad is your breathing? (e.g., mild, moderate, severe)      mild 5. RECURRENT SYMPTOM: Have you had difficulty breathing before? If Yes, ask: When was the last time? and What happened that time?      yes 6. CARDIAC HISTORY: Do you have any history of heart disease? (e.g., heart attack, angina, bypass surgery, angioplasty)      Heart murmur 7. LUNG HISTORY: Do you have any history of lung disease?  (e.g., pulmonary embolus, asthma, emphysema)     Yes - but unsure why 8. CAUSE: What do you think is causing the breathing problem?      unsure 9. OTHER SYMPTOMS: Do you have any other symptoms? (e.g.,  chest pain, cough, dizziness, fever, runny nose)     Cough, wheezing  Protocols used: Breathing Difficulty-A-AH

## 2024-05-21 ENCOUNTER — Ambulatory Visit (HOSPITAL_BASED_OUTPATIENT_CLINIC_OR_DEPARTMENT_OTHER)

## 2024-05-21 ENCOUNTER — Encounter (HOSPITAL_BASED_OUTPATIENT_CLINIC_OR_DEPARTMENT_OTHER): Payer: Self-pay

## 2024-05-21 VITALS — BP 151/83 | HR 84 | Temp 97.6°F | Ht 62.0 in | Wt 259.3 lb

## 2024-05-21 DIAGNOSIS — G4733 Obstructive sleep apnea (adult) (pediatric): Secondary | ICD-10-CM

## 2024-05-21 DIAGNOSIS — J45901 Unspecified asthma with (acute) exacerbation: Secondary | ICD-10-CM | POA: Diagnosis not present

## 2024-05-21 DIAGNOSIS — J309 Allergic rhinitis, unspecified: Secondary | ICD-10-CM

## 2024-05-21 MED ORDER — ALBUTEROL SULFATE HFA 108 (90 BASE) MCG/ACT IN AERS: 2.0000 | INHALATION_SPRAY | Freq: Four times a day (QID) | RESPIRATORY_TRACT | 6 refills | Status: AC | PRN

## 2024-05-21 MED ORDER — PREDNISONE 10 MG PO TABS: ORAL_TABLET | ORAL | 0 refills | Status: AC

## 2024-05-21 MED ORDER — AZITHROMYCIN 250 MG PO TABS: ORAL_TABLET | ORAL | 0 refills | Status: AC

## 2024-05-21 NOTE — Patient Instructions (Addendum)
 Start Prednisone taper and Azithromycin- finish all medication.  Use Albuterol 2 puffs every 4-6 hours as needed for cough/shortness of breath  Return to clinic in 4-6 weeks for PFTs and follow up.  Continue Flonase; stop Zyrtec and start Claritin 10 mg daily or Allegra 180 mg daily.  Continue Mucinex twice daily as needed.

## 2024-05-21 NOTE — Progress Notes (Signed)
@Patient  ID: Kathryn Pittman, female    DOB: 1956/04/16, 68 y.o.   MRN: [id number]  Chief Complaint  Patient presents with   Cough    Three weeks of cough, post nasal drip    Referring provider: Seabron Lenis, MD  HPI: Discussed the use of AI scribe software for clinical note transcription with the patient, who gave verbal consent to proceed.  History of Present Illness Kathryn Pittman is a 68 year old female with recurrent respiratory issues who presents with acute on chronic cough and respiratory symptoms. She was referred by her primary care doctor for further evaluation of her chronic respiratory issues.  She has experienced chronic respiratory issues since the spring of the previous year, initially diagnosed as pneumonia. Over the summer, she had recurrent episodes of bronchitis-like symptoms, requiring frequent visits to a walk-in clinic for treatment. In the fall, a regimen of prednisone, cough suppressants, and azithromycin provided temporary relief.  In the spring of this year, her symptoms recurred, prompting treatment before her rotator cuff surgery in June. She was treated with similar medications, including an inhaler, which offered temporary relief. She is concerned that using the inhaler might make it harder for her to know what is going on with her symptoms.  Her current symptoms began with postnasal drip and congestion, progressing to a cough that intensifies with deep breaths. She occasionally produces white phlegm, with past episodes yielding yellowish phlegm. She experiences hoarseness, chest tightness, and a sensation of wheezing, which her husband also notices. She feels run down, with extreme coughing disrupting her sleep, and experiences occasional dizziness and chest tightness during severe episodes.  Her current medications include Zyrtec 10 mg daily, Flonase for congestion, Mucinex, and an inhaler (Wixela) used as needed for chest tightness. She was previously on an  ACE inhibitor for blood pressure, which was changed to an ARB due to a suspected cough side effect.    She has a history of exposure to secondhand smoke from her parents but has never smoked herself. She also has sleep apnea, for which she uses a CPAP machine nightly without issues.  Compliance is followed by another provider. She reports excellent compliance and benefit from use of the machine.  No download available for review.  Last OV 07/19/2023: Patient did have an upper respite tract infection about April May Sinus congestion, cough This persisted for We will With recurrence of symptoms in June she used a course of Z-Pak Appeared to improve for a little while and then recurrence croupy cough   She went to urgent care where she was given a course of antibiotics and steroids   Symptoms appear to improve a little better and then recurred She made another urgent care visit and was given a different course of antibiotic and instructed on Trelegy   Has not been using Trelegy on a regular basis since symptoms of significantly improved   In the last couple of weeks she did have some congestion for which to use Mucinex which seemed to help   Has a history of reflux, does not feel this is contributing to symptoms Did have surgery for large hiatal hernia previously   Chest x-ray at some point that showed pneumonia, a more recent CT scan of the chest does not show any significant infiltrate   Never smoker   Did not have a history of asthma   She does have obstructive sleep apnea for which he uses CPAP on a regular basis, compliant, benefiting from  CPAP use   She worked in a daycare, no other occupational predisposition to lung disease No obvious previous presence of lung disease   She does have macular degeneration   TEST/EVENTS :  Chest CT 03/22/2023: FINDINGS: Cardiovascular: Heart size normal. Trace pericardial fluid. Scattered LAD coronary calcifications. Central great  vessels unremarkable.   Mediastinum/Nodes: No mass or adenopathy.   Lungs/Pleura: No pleural effusion. No pneumothorax. Minimal dependent atelectasis posteriorly in the lower lobes. Lungs otherwise clear.   Upper Abdomen: Small hiatal hernia. Cholecystectomy clips. Peripherally calcified 4.9 cm 11 HU right upper pole renal lesion, new since previous.   Musculoskeletal: No fracture or worrisome lesion.  CPAP titration completed in 2008:  best tested pressure of 16/12 BiPAP  Allergies  Allergen Reactions   Excedrin Extra Strength [Aspirin-Acetaminophen-Caffeine] Itching     There is no immunization history on file for this patient.  Past Medical History:  Diagnosis Date   Allergic rhinitis    Anemia    hx of low iron   Arthritis    GERD (gastroesophageal reflux disease)    with Barretts esophagus   Heart murmur    slight    History of hiatal hernia    has been repaired   History of kidney stones    Hypertension    Legally blind    Macular degeneration, bilateral    Morton's neuroma    Left   Onychomycosis    OSA (obstructive sleep apnea)    on BiPAP 167/13cm H2O   Osteoarthritis, shoulder    right   Psoriasis    TMJ syndrome    Left>right    Tobacco History: Social History   Tobacco Use  Smoking Status Never  Smokeless Tobacco Never   Counseling given: Not Answered   Outpatient Medications Prior to Visit  Medication Sig Dispense Refill   Biotin w/ Vitamins C & E (HAIR/SKIN/NAILS PO) Take by mouth.     cetirizine (ZYRTEC) 10 MG tablet Take 10 mg by mouth daily.     fluticasone (FLONASE) 50 MCG/ACT nasal spray Place 2 sprays into both nostrils daily as needed for allergies.      fluticasone-salmeterol (WIXELA INHUB) 250-50 MCG/ACT AEPB Inhale 1 puff into the lungs in the morning and at bedtime.     Multiple Vitamins-Minerals (VITEYES AREDS FORMULA/LUTEIN PO) Take 1 tablet by mouth daily.      Nutritional Supplements (IMMUNE ENHANCE PO) Take by  mouth.     OZEMPIC, 1 MG/DOSE, 4 MG/3ML SOPN Inject 1 mg into the skin once a week.     pantoprazole (PROTONIX) 40 MG tablet Take 40 mg by mouth daily.     rosuvastatin (CRESTOR) 5 MG tablet Take 5 mg by mouth once a week.     SKYRIZI, 150 MG DOSE, 75 MG/0.83ML PSKT as directed. Every 12 weeks     telmisartan (MICARDIS) 80 MG tablet Take 80 mg by mouth daily.     vitamin B-12 (CYANOCOBALAMIN) 500 MCG tablet Take 500 mcg by mouth daily.     No facility-administered medications prior to visit.     Review of Systems: as per HPI  Constitutional:   No  weight loss, night sweats,  Fevers, chills, fatigue, or  lassitude.  HEENT:   No headaches,  Difficulty swallowing,  Tooth/dental problems, or  Sore throat,                No sneezing, itching, ear ache, nasal congestion, post nasal drip,   CV:  No chest pain,  Orthopnea, PND, swelling in lower extremities, anasarca, dizziness, palpitations, syncope.   GI  No heartburn, indigestion, abdominal pain, nausea, vomiting, diarrhea, change in bowel habits, loss of appetite, bloody stools.   Resp: No shortness of breath with exertion or at rest.  No excess mucus, no productive cough,  No non-productive cough,  No coughing up of blood.  No change in color of mucus.  No wheezing.  No chest wall deformity  Skin: no rash or lesions.  GU: no dysuria, change in color of urine, no urgency or frequency.  No flank pain, no hematuria   MS:  No joint pain or swelling.  No decreased range of motion.  No back pain.    Physical Exam  BP (!) 151/83   Pulse 84   Temp 97.6 F (36.4 C)   Ht 5' 2 (1.575 m)   Wt 259 lb 4.8 oz (117.6 kg)   SpO2 97%   BMI 47.43 kg/m   GEN: A/Ox3; pleasant , NAD, well nourished.  Speaks in full sentences.   HEENT:  [place]/AT,  EACs-clear, TMs-wnl, NOSE-clear, THROAT no erythema, some cobblestoning noted no postnasal drip or exudate noted.   NECK:  Supple w/ fair ROM; no JVD; normal carotid impulses w/o bruits; no thyromegaly  or nodules palpated; no lymphadenopathy.   RESP Expiratory wheezes throughout, does not clear with cough P & A; w/o,  rales/ or rhonchi. no accessory muscle use, no dullness to percussion  CARD:  RRR, no m/r/g, no peripheral edema, pulses intact, no cyanosis or clubbing.  GI:   Soft & nt; nml bowel sounds; no organomegaly or masses detected.   Musco: Warm bil, no deformities or joint swelling noted.   Neuro: alert, no focal deficits noted.    Skin: Warm, no lesions or rashes    Lab Results:  CBC No results found for: WBC, RBC, HGB, HCT, PLT, MCV, MCH, MCHC, RDW, LYMPHSABS, MONOABS, EOSABS, BASOSABS  BMET No results found for: NA, K, CL, CO2, GLUCOSE, BUN, CREATININE, CALCIUM, GFRNONAA, GFRAA  BNP No results found for: BNP  ProBNP No results found for: PROBNP  Imaging: No results found.  Administration History     None           No data to display          No results found for: NITRICOXIDE   Assessment & Plan:   Assessment & Plan OSA (obstructive sleep apnea)  Asthmatic bronchitis with acute exacerbation, unspecified asthma severity, unspecified whether persistent  Assessment and Plan Assessment & Plan Asthma with acute exacerbation Chronic respiratory symptoms with recent exacerbation suggestive of asthma, possibly triggered by allergies or environmental factors. Differential includes bronchitis and asthma overlap. Previous treatments included prednisone, azithromycin, and inhalers. Current exacerbation requires acute management and further diagnostic evaluation. - Prescribed prednisone taper for acute exacerbation. - Prescribed azithromycin (Z-Pak) for potential bacterial component. - Prescribed albuterol inhaler for symptomatic relief. - Continue Mucinex for mucus management. - Continue Tessalon Perles for cough management. - Ordered pulmonary function tests (PFT) to confirm asthma diagnosis. -  Scheduled follow-up appointment in 4-6 weeks for PFT results and further management.  Allergic rhinitis Chronic postnasal drip and congestion suggestive of allergic rhinitis. Symptoms persist despite current management with Zyrtec and Flonase. Consideration of medication tolerance and potential benefit of alternating antihistamines. - Discontinued Zyrtec. - Start Claritin or Allegra as preferred antihistamine. - Continue Flonase for nasal congestion.    Return in about 5 weeks (around 06/25/2024) for PFT.  Candis Dandy, PA-C  05/21/2024   

## 2024-07-02 ENCOUNTER — Ambulatory Visit (INDEPENDENT_AMBULATORY_CARE_PROVIDER_SITE_OTHER)

## 2024-07-02 ENCOUNTER — Ambulatory Visit (HOSPITAL_BASED_OUTPATIENT_CLINIC_OR_DEPARTMENT_OTHER)

## 2024-07-02 ENCOUNTER — Encounter (HOSPITAL_BASED_OUTPATIENT_CLINIC_OR_DEPARTMENT_OTHER): Payer: Self-pay

## 2024-07-02 VITALS — BP 128/61 | HR 90 | Ht 62.0 in | Wt 261.0 lb

## 2024-07-02 DIAGNOSIS — G4733 Obstructive sleep apnea (adult) (pediatric): Secondary | ICD-10-CM

## 2024-07-02 DIAGNOSIS — J309 Allergic rhinitis, unspecified: Secondary | ICD-10-CM

## 2024-07-02 DIAGNOSIS — J45901 Unspecified asthma with (acute) exacerbation: Secondary | ICD-10-CM

## 2024-07-02 DIAGNOSIS — J4520 Mild intermittent asthma, uncomplicated: Secondary | ICD-10-CM

## 2024-07-02 LAB — PULMONARY FUNCTION TEST
DL/VA % pred: 123 %
DL/VA: 5.2 ml/min/mmHg/L
DLCO unc % pred: 104 %
DLCO unc: 19.2 ml/min/mmHg
FEF 25-75 Post: 1.85 L/s
FEF 25-75 Pre: 1.03 L/s
FEF2575-%Change-Post: 79 %
FEF2575-%Pred-Post: 98 %
FEF2575-%Pred-Pre: 54 %
FEV1-%Change-Post: 14 %
FEV1-%Pred-Post: 81 %
FEV1-%Pred-Pre: 71 %
FEV1-Post: 1.74 L
FEV1-Pre: 1.52 L
FEV1FVC-%Change-Post: 7 %
FEV1FVC-%Pred-Pre: 96 %
FEV6-%Change-Post: 6 %
FEV6-%Pred-Post: 81 %
FEV6-%Pred-Pre: 76 %
FEV6-Post: 2.21 L
FEV6-Pre: 2.07 L
FEV6FVC-%Pred-Post: 104 %
FEV6FVC-%Pred-Pre: 104 %
FVC-%Change-Post: 6 %
FVC-%Pred-Post: 78 %
FVC-%Pred-Pre: 73 %
FVC-Post: 2.21 L
FVC-Pre: 2.07 L
Post FEV1/FVC ratio: 79 %
Post FEV6/FVC ratio: 100 %
Pre FEV1/FVC ratio: 73 %
Pre FEV6/FVC Ratio: 100 %
RV % pred: 137 %
RV: 2.81 L
TLC % pred: 106 %
TLC: 5.07 L

## 2024-07-02 MED ORDER — FLUTICASONE-SALMETEROL 250-50 MCG/ACT IN AEPB: 1.0000 | INHALATION_SPRAY | Freq: Two times a day (BID) | RESPIRATORY_TRACT | 4 refills | Status: AC

## 2024-07-02 NOTE — Progress Notes (Signed)
 Full PFT performed today.

## 2024-07-02 NOTE — Patient Instructions (Signed)
 Start Wixela one puff inhaled twice daily.  Use Albuterol  2 puffs every 4-6 hours as needed for shortness of breath, cough.  Follow up in 6 months; return sooner if new or worsening symptoms.  Stop Zyrtec.  Continue Allegra.

## 2024-07-02 NOTE — Progress Notes (Signed)
 "  @Patient  ID: Kathryn Pittman, female    DOB: July 12, 1955, 68 y.o.   MRN: [id number]  Chief Complaint  Patient presents with   Follow-up    PFT    Referring provider: Seabron Lenis, MD  HPI: Discussed the use of AI scribe software for clinical note transcription with the patient, who gave verbal consent to proceed.  History of Present Illness Kathryn Pittman is a 68 year old female with asthma who presents with wheezing and shortness of breath. She is accompanied by her husband, Harden.  Her respiratory symptoms, including wheezing and shortness of breath, began to worsen right before Christmas. Her voice is described as 'a little raspy', attributed to drainage. She had been prescribed Wixela by her PCP but had not been using it.  She started using her Wixela on Saturday night when her symptoms seemed to be getting worse. She felt improvement with the [facility], but stopped using it on Sunday.  She experiences episodes of shortness of breath and cough, described as 'heavier' and 'tight' in her chest. She also experiences raspy sounds, attributed to drainage and difficulty breathing. She takes an allergy pill daily and uses Mucinex when symptoms arise. She has not used Wixela since Saturday night.  No fever or chills. Occasional cough with clear or slightly yellow-tinged sputum. She switched from Zyrtec to Allegra for her allergies since her last appointment.  Today she completed her PFTs which are consistent with asthma with a positive bronchodilator response.  Last OV 05/21/2024: MALENI Pittman is a 68 year old female with recurrent respiratory issues who presents with acute on chronic cough and respiratory symptoms. She was referred by her primary care doctor for further evaluation of her chronic respiratory issues.   She has experienced chronic respiratory issues since the spring of the previous year, initially diagnosed as pneumonia. Over the summer, she had recurrent episodes of  bronchitis-like symptoms, requiring frequent visits to a walk-in clinic for treatment. In the fall, a regimen of prednisone , cough suppressants, and azithromycin  provided temporary relief.   In the spring of this year, her symptoms recurred, prompting treatment before her rotator cuff surgery in June. She was treated with similar medications, including an inhaler, which offered temporary relief. She is concerned that using the inhaler might make it harder for her to know what is going on with her symptoms.   Her current symptoms began with postnasal drip and congestion, progressing to a cough that intensifies with deep breaths. She occasionally produces white phlegm, with past episodes yielding yellowish phlegm. She experiences hoarseness, chest tightness, and a sensation of wheezing, which her husband also notices. She feels run down, with extreme coughing disrupting her sleep, and experiences occasional dizziness and chest tightness during severe episodes.   Her current medications include Zyrtec 10 mg daily, Flonase for congestion, Mucinex, and an inhaler (Wixela) used as needed for chest tightness. She was previously on an ACE inhibitor for blood pressure, which was changed to an ARB due to a suspected cough side effect.     She has a history of exposure to secondhand smoke from her parents but has never smoked herself. She also has sleep apnea, for which she uses a CPAP machine nightly without issues.  Compliance is followed by another provider. She reports excellent compliance and benefit from use of the machine.  No download available for review.    TEST/EVENTS :  Chest CT 03/22/2023: FINDINGS: Cardiovascular: Heart size normal. Trace pericardial fluid. Scattered LAD coronary calcifications.  Central great vessels unremarkable.   Mediastinum/Nodes: No mass or adenopathy.   Lungs/Pleura: No pleural effusion. No pneumothorax. Minimal dependent atelectasis posteriorly in the lower lobes.  Lungs otherwise clear.   Upper Abdomen: Small hiatal hernia. Cholecystectomy clips. Peripherally calcified 4.9 cm 11 HU right upper pole renal lesion, new since previous.   Musculoskeletal: No fracture or worrisome lesion.   CPAP titration completed in 2008:  best tested pressure of 16/12 BiPAP Allergies[1]   There is no immunization history on file for this patient.  Past Medical History:  Diagnosis Date   Allergic rhinitis    Anemia    hx of low iron   Arthritis    GERD (gastroesophageal reflux disease)    with Barretts esophagus   Heart murmur    slight    History of hiatal hernia    has been repaired   History of kidney stones    Hypertension    Legally blind    Macular degeneration, bilateral    Morton's neuroma    Left   Onychomycosis    OSA (obstructive sleep apnea)    on BiPAP 167/13cm H2O   Osteoarthritis, shoulder    right   Psoriasis    TMJ syndrome    Left>right    Tobacco History: Tobacco Use History[2] Counseling given: Not Answered   Outpatient Medications Prior to Visit  Medication Sig Dispense Refill   albuterol  (VENTOLIN  HFA) 108 (90 Base) MCG/ACT inhaler Inhale 2 puffs into the lungs every 6 (six) hours as needed for wheezing or shortness of breath. 8 g 6   azithromycin  (ZITHROMAX ) 250 MG tablet 2 tabs today, then 1 tab daily on days 2-5 6 tablet 0   Biotin w/ Vitamins C & E (HAIR/SKIN/NAILS PO) Take by mouth.     cetirizine (ZYRTEC) 10 MG tablet Take 10 mg by mouth daily.     fluticasone (FLONASE) 50 MCG/ACT nasal spray Place 2 sprays into both nostrils daily as needed for allergies.      Multiple Vitamins-Minerals (VITEYES AREDS FORMULA/LUTEIN PO) Take 1 tablet by mouth daily.      Nutritional Supplements (IMMUNE ENHANCE PO) Take by mouth.     OZEMPIC, 1 MG/DOSE, 4 MG/3ML SOPN Inject 1 mg into the skin once a week.     pantoprazole (PROTONIX) 40 MG tablet Take 40 mg by mouth daily.     rosuvastatin (CRESTOR) 5 MG tablet Take 5 mg by  mouth once a week.     SKYRIZI, 150 MG DOSE, 75 MG/0.83ML PSKT as directed. Every 12 weeks     telmisartan (MICARDIS) 80 MG tablet Take 80 mg by mouth daily.     vitamin B-12 (CYANOCOBALAMIN) 500 MCG tablet Take 500 mcg by mouth daily.     fluticasone-salmeterol (WIXELA INHUB) 250-50 MCG/ACT AEPB Inhale 1 puff into the lungs in the morning and at bedtime.     No facility-administered medications prior to visit.     Review of Systems: as per hpi  Constitutional:   No  weight loss, night sweats,  Fevers, chills, fatigue, or  lassitude.  HEENT:   No headaches,  Difficulty swallowing,  Tooth/dental problems, or  Sore throat,                No sneezing, itching, ear ache, nasal congestion, post nasal drip,   CV:  No chest pain,  Orthopnea, PND, swelling in lower extremities, anasarca, dizziness, palpitations, syncope.   GI  No heartburn, indigestion, abdominal pain, nausea, vomiting, diarrhea, change in  bowel habits, loss of appetite, bloody stools.   Resp: No shortness of breath with exertion or at rest.  No excess mucus, no productive cough,  No non-productive cough,  No coughing up of blood.  No change in color of mucus.  No wheezing.  No chest wall deformity  Skin: no rash or lesions.  GU: no dysuria, change in color of urine, no urgency or frequency.  No flank pain, no hematuria   MS:  No joint pain or swelling.  No decreased range of motion.  No back pain.    Physical Exam  BP 128/61   Pulse 90   Ht 5' 2 (1.575 m)   Wt 261 lb (118.4 kg)   SpO2 97%   BMI 47.74 kg/m   GEN: A/Ox3; pleasant , NAD, well nourished    HEENT:  [place]/AT,  EACs-clear, TMs-wnl, NOSE-clear, THROAT-clear, no lesions, no postnasal drip or exudate noted.   NECK:  Supple w/ fair ROM; no JVD; normal carotid impulses w/o bruits; no thyromegaly or nodules palpated; no lymphadenopathy.    RESP  scattered wheezes with fairly good aeration P & A; w/o, rales/ or rhonchi. no accessory muscle use, no dullness to  percussion  CARD:  RRR, no m/r/g, no peripheral edema, pulses intact, no cyanosis or clubbing.  GI:   Soft & nt; nml bowel sounds; no organomegaly or masses detected.   Musco: Warm bil, no deformities or joint swelling noted.   Neuro: alert, no focal deficits noted.    Skin: Warm, no lesions or rashes    Lab Results:  CBC No results found for: WBC, RBC, HGB, HCT, PLT, MCV, MCH, MCHC, RDW, LYMPHSABS, MONOABS, EOSABS, BASOSABS  BMET No results found for: NA, K, CL, CO2, GLUCOSE, BUN, CREATININE, CALCIUM, GFRNONAA, GFRAA  BNP No results found for: BNP  ProBNP No results found for: PROBNP  Imaging: No results found.  Administration History     None          Latest Ref Rng & Units 07/02/2024    9:48 AM  PFT Results  FVC-Pre L 2.07  P  FVC-Predicted Pre % 73  P  FVC-Post L 2.21  P  FVC-Predicted Post % 78  P  Pre FEV1/FVC % % 73  P  Post FEV1/FCV % % 79  P  FEV1-Pre L 1.52  P  FEV1-Predicted Pre % 71  P  FEV1-Post L 1.74  P  DLCO uncorrected ml/min/mmHg 19.20  P  DLCO UNC% % 104  P  DLVA Predicted % 123  P  TLC L 5.07  P  TLC % Predicted % 106  P  RV % Predicted % 137  P    P Preliminary result    No results found for: NITRICOXIDE   Assessment & Plan:   Assessment & Plan Mild intermittent asthma without complication  OSA (obstructive sleep apnea)  Assessment and Plan Assessment & Plan Asthma  Asthma  with wheezing and chest tightness, likely triggered by seasonal allergies and environmental factors. Positive response to albuterol  indicates hyperreactive airway. Lung function tests show normal lung volumes and DLCO but reduced flows, improved by 14% with bronchodilator, confirming asthma diagnosis. - Resume Wixela inhaler, one puff twice daily. - Use albuterol  inhaler, two puffs every four to six hours as needed for symptoms. - Monitor symptoms and adjust therapy based on symptom control and  environmental triggers. - Educated on potential for thrush with Wixela and advised on oral hygiene post-inhalation.  Allergic rhinitis Contributing to  postnasal drip and drainage, exacerbating asthma symptoms. - Continue daily allergy medication (Allegra). - Use Mucinex as needed for mucus management.    Return in about 6 months (around 12/31/2024) for asthma.  Candis Dandy, PA-C 07/02/2024      [1]  Allergies Allergen Reactions   Excedrin Extra Strength [Aspirin-Acetaminophen-Caffeine] Itching  [2]  Social History Tobacco Use  Smoking Status Never  Smokeless Tobacco Never   "

## 2024-07-02 NOTE — Patient Instructions (Signed)
 Full PFT performed today.

## 2024-12-31 ENCOUNTER — Ambulatory Visit (HOSPITAL_BASED_OUTPATIENT_CLINIC_OR_DEPARTMENT_OTHER)
